# Patient Record
Sex: Female | Born: 1960 | Race: Black or African American | Hispanic: No | Marital: Single | Smoking: Never smoker
Health system: Southern US, Community
[De-identification: ages and names within clinical notes are randomized; demographics above are authoritative.]

## PROBLEM LIST (undated history)

## (undated) DIAGNOSIS — K589 Irritable bowel syndrome without diarrhea: Secondary | ICD-10-CM

## (undated) DIAGNOSIS — I10 Essential (primary) hypertension: Secondary | ICD-10-CM

## (undated) DIAGNOSIS — K219 Gastro-esophageal reflux disease without esophagitis: Secondary | ICD-10-CM

---

## 2009-05-22 ENCOUNTER — Ambulatory Visit: Payer: Self-pay | Admitting: Family Medicine

## 2010-07-21 ENCOUNTER — Emergency Department: Payer: Self-pay | Admitting: Emergency Medicine

## 2010-12-19 DIAGNOSIS — R109 Unspecified abdominal pain: Secondary | ICD-10-CM | POA: Insufficient documentation

## 2010-12-19 DIAGNOSIS — I10 Essential (primary) hypertension: Secondary | ICD-10-CM | POA: Insufficient documentation

## 2010-12-19 DIAGNOSIS — M79609 Pain in unspecified limb: Secondary | ICD-10-CM | POA: Insufficient documentation

## 2010-12-19 NOTE — ED Notes (Signed)
C/o right groin pain tonight; left LE throbbing for 4 months and right foot throbbing for 2 weeks and right arm throbbing as well

## 2010-12-20 ENCOUNTER — Emergency Department (HOSPITAL_COMMUNITY)
Admission: EM | Admit: 2010-12-20 | Discharge: 2010-12-20 | Disposition: A | Discharge status: Home / Self Care | Payer: BC Managed Care – PPO | Attending: Emergency Medicine | Admitting: Emergency Medicine

## 2010-12-20 ENCOUNTER — Encounter: Payer: Self-pay | Admitting: *Deleted

## 2010-12-20 DIAGNOSIS — M79606 Pain in leg, unspecified: Secondary | ICD-10-CM

## 2010-12-20 HISTORY — DX: Essential (primary) hypertension: I10

## 2010-12-20 MED ORDER — IBUPROFEN 800 MG PO TABS
800.0000 mg | ORAL_TABLET | Freq: Once | ORAL | Status: AC
  Administered 2010-12-20: 800 mg via ORAL
  Filled 2010-12-20: qty 1

## 2010-12-20 MED ORDER — IBUPROFEN 800 MG PO TABS: 800.0000 mg | ORAL_TABLET | Freq: Three times a day (TID) | ORAL | Status: AC

## 2011-01-07 NOTE — ED Provider Notes (Signed)
History     CSN: [id number] Arrival date & time: 12/20/2010 12:19 AM   None     Chief Complaint  Patient presents with  . Groin Pain    (Consider location/radiation/quality/duration/timing/severity/associated sxs/prior treatment) HPI Comments: Seen 0246. Patient with right sided complaints. Right groin pain x 4 months that is worse with movement. Better after a night's sleep. Has not taken any medications. Also has had right foot burning and aching for 2 weeks without a known injury. Elevation helps a little. Has not taken any medications. In the last two days, right arm has begun aching like the foot. Denies weakness, numbness, tingling, fever, chills. Denies difficulty speaking or swallowing. Patient is right handed and has no change in the strength of the hand.  Patient is a 50 y.o. female presenting with groin pain. The history is provided by the patient.  Groin Pain This is a chronic (groin pain x 4 months) problem. The problem occurs constantly. The problem has not changed since onset.Associated symptoms comments: Right foot pain. The symptoms are aggravated by bending and twisting. The symptoms are relieved by nothing. She has tried nothing for the symptoms.    Past Medical History  Diagnosis Date  . Hypertension     Past Surgical History  Procedure Date  . Cesarean section     No family history on file.  History  Substance Use Topics  . Smoking status: Never Smoker   . Smokeless tobacco: Not on file  . Alcohol Use: No    OB History    Grav Para Term Preterm Abortions TAB SAB Ect Mult Living                  Review of Systems  Musculoskeletal:       Right arm, right foot, and right groin pain  All other systems reviewed and are negative.    Allergies  Review of patient's allergies indicates no known allergies.  Home Medications   Current Outpatient Rx  Name Route Sig Dispense Refill  . HYDROCHLOROTHIAZIDE 25 MG PO TABS Oral Take 25 mg by mouth  daily.        BP 155/78  Pulse 64  Temp(Src) 97.9 F (36.6 C) (Oral)  Resp 18  Ht 5' (1.524 m)  Wt 165 lb (74.844 kg)  BMI 32.22 kg/m2  SpO2 100%  LMP 12/12/2010  Physical Exam  Nursing note and vitals reviewed. Constitutional: She is oriented to person, place, and time. She appears well-developed and well-nourished. No distress.  HENT:  Head: Normocephalic and atraumatic.  Eyes: EOM are normal.  Neck: Normal range of motion. Neck supple.  Cardiovascular: Normal rate, normal heart sounds and intact distal pulses.   Pulmonary/Chest: Effort normal and breath sounds normal.  Abdominal: Soft. Bowel sounds are normal.  Musculoskeletal:       Right shoulder with FROM, no crepitus, no deformities. Pulss 2+. Cap refill brisk. Strength 5/5. Right foot without lesions. FROM at ankle. Able to stand on toes. No lesions, deformities, swelling. Cap refill brisk. DP and PT pulses 2+. Right groin with tenderness to palpation. No lesions, swollen nodes, rash. FROM at hip without additional discomfort to the groin.  Neurological: She is alert and oriented to person, place, and time. She has normal reflexes.  Skin: Skin is warm and dry.    ED Course  Procedures (including critical care time)  Labs Reviewed - No data to display No results found.   1. Leg pain  MDM  Patient with right sided body pain without focal findings on PE except for mild tenderness to right groin. Given ibuprofen for discomfort. Follow up with PCP.Pt stable in ED with no significant deterioration in condition. MDM Reviewed: nursing note and vitals        Nicoletta Dress. Colon Branch, MD 01/07/11 1332

## 2012-01-30 ENCOUNTER — Ambulatory Visit: Payer: Self-pay | Admitting: Family Medicine

## 2012-02-04 ENCOUNTER — Ambulatory Visit: Payer: Self-pay | Admitting: Family Medicine

## 2012-08-27 ENCOUNTER — Emergency Department: Payer: Self-pay | Admitting: Emergency Medicine

## 2012-08-27 LAB — COMPREHENSIVE METABOLIC PANEL
Alkaline Phosphatase: 51 U/L (ref 50–136)
Chloride: 108 mmol/L — ABNORMAL HIGH (ref 98–107)
Creatinine: 0.56 mg/dL — ABNORMAL LOW (ref 0.60–1.30)
EGFR (African American): >60
EGFR (Non-African Amer.): >60
Glucose: 92 mg/dL (ref 65–99)
Total Protein: 7.9 g/dL (ref 6.4–8.2)

## 2012-08-27 LAB — URINALYSIS, COMPLETE
Bacteria: NONE SEEN
Glucose,UR: NEGATIVE mg/dL (ref 0–75)
Ketone: NEGATIVE
Leukocyte Esterase: NEGATIVE
Ph: 7 (ref 4.5–8.0)
Squamous Epithelial: <1

## 2012-08-27 LAB — TROPONIN I: Troponin-I: <0.02 ng/mL

## 2012-08-27 LAB — CBC WITH DIFFERENTIAL/PLATELET
Basophil #: 0 10*3/uL (ref 0.0–0.1)
Eosinophil %: 2.1 %
HCT: 38.4 % (ref 35.0–47.0)
HGB: 12.9 g/dL (ref 12.0–16.0)
MCH: 30.2 pg (ref 26.0–34.0)
MCV: 90 fL (ref 80–100)
Neutrophil #: 2.2 10*3/uL (ref 1.4–6.5)

## 2012-08-27 LAB — CK TOTAL AND CKMB (NOT AT ARMC): CK-MB: 1 ng/mL (ref 0.5–3.6)

## 2013-06-19 ENCOUNTER — Emergency Department: Payer: Self-pay | Admitting: Emergency Medicine

## 2013-06-19 LAB — BASIC METABOLIC PANEL
Anion Gap: 6 — ABNORMAL LOW (ref 7–16)
BUN: 10 mg/dL (ref 7–18)
CALCIUM: 9.7 mg/dL (ref 8.5–10.1)
Chloride: 98 mmol/L (ref 98–107)
Co2: 29 mmol/L (ref 21–32)
Creatinine: 0.87 mg/dL (ref 0.60–1.30)
EGFR (African American): >60
EGFR (Non-African Amer.): >60
Glucose: 109 mg/dL — ABNORMAL HIGH (ref 65–99)
Osmolality: 266 (ref 275–301)
POTASSIUM: 3.6 mmol/L (ref 3.5–5.1)
SODIUM: 133 mmol/L — AB (ref 136–145)

## 2013-06-19 LAB — CBC
HCT: 38.7 % (ref 35.0–47.0)
HGB: 12.3 g/dL (ref 12.0–16.0)
MCH: 29.1 pg (ref 26.0–34.0)
MCHC: 31.9 g/dL — ABNORMAL LOW (ref 32.0–36.0)
MCV: 91 fL (ref 80–100)
Platelet: 260 10*3/uL (ref 150–440)
RBC: 4.25 10*6/uL (ref 3.80–5.20)
RDW: 13.8 % (ref 11.5–14.5)
WBC: 11.6 10*3/uL — AB (ref 3.6–11.0)

## 2013-06-19 LAB — HEPATIC FUNCTION PANEL A (ARMC)
ALBUMIN: 3.9 g/dL (ref 3.4–5.0)
ALT: 23 U/L (ref 12–78)
AST: 12 U/L — AB (ref 15–37)
Alkaline Phosphatase: 50 U/L
BILIRUBIN TOTAL: 1 mg/dL (ref 0.2–1.0)
Bilirubin, Direct: 0.2 mg/dL (ref 0.00–0.20)
Total Protein: 8.4 g/dL — ABNORMAL HIGH (ref 6.4–8.2)

## 2013-06-19 LAB — LIPASE, BLOOD: LIPASE: 225 U/L (ref 73–393)

## 2013-06-19 LAB — TROPONIN I: Troponin-I: <0.02 ng/mL

## 2014-02-17 DIAGNOSIS — K922 Gastrointestinal hemorrhage, unspecified: Secondary | ICD-10-CM | POA: Insufficient documentation

## 2014-02-17 DIAGNOSIS — K625 Hemorrhage of anus and rectum: Secondary | ICD-10-CM | POA: Insufficient documentation

## 2014-03-24 ENCOUNTER — Ambulatory Visit: Payer: Self-pay | Admitting: Family Medicine

## 2014-04-13 ENCOUNTER — Emergency Department: Payer: Self-pay | Admitting: Emergency Medicine

## 2014-04-13 LAB — CBC WITH DIFFERENTIAL/PLATELET
BASOS PCT: 0.3 %
Basophil #: 0 10*3/uL (ref 0.0–0.1)
EOS ABS: 0 10*3/uL (ref 0.0–0.7)
EOS PCT: 0.1 %
HCT: 41.7 % (ref 35.0–47.0)
HGB: 13.4 g/dL (ref 12.0–16.0)
Lymphocyte #: 0.3 10*3/uL — ABNORMAL LOW (ref 1.0–3.6)
Lymphocyte %: 3.6 %
MCH: 28.9 pg (ref 26.0–34.0)
MCHC: 32.2 g/dL (ref 32.0–36.0)
MCV: 90 fL (ref 80–100)
MONO ABS: 0.4 x10 3/mm (ref 0.2–0.9)
Monocyte %: 4.4 %
NEUTROS ABS: 8.2 10*3/uL — AB (ref 1.4–6.5)
Neutrophil %: 91.6 %
Platelet: 289 10*3/uL (ref 150–440)
RBC: 4.64 10*6/uL (ref 3.80–5.20)
RDW: 14.3 % (ref 11.5–14.5)
WBC: 8.9 10*3/uL (ref 3.6–11.0)

## 2014-04-13 LAB — COMPREHENSIVE METABOLIC PANEL
ALK PHOS: 51 U/L (ref 46–116)
ANION GAP: 6 — AB (ref 7–16)
Albumin: 3.9 g/dL (ref 3.4–5.0)
BILIRUBIN TOTAL: 0.7 mg/dL (ref 0.2–1.0)
BUN: 11 mg/dL (ref 7–18)
CALCIUM: 9.4 mg/dL (ref 8.5–10.1)
CHLORIDE: 103 mmol/L (ref 98–107)
CO2: 29 mmol/L (ref 21–32)
CREATININE: 0.73 mg/dL (ref 0.60–1.30)
Glucose: 106 mg/dL — ABNORMAL HIGH (ref 65–99)
OSMOLALITY: 275 (ref 275–301)
POTASSIUM: 3.5 mmol/L (ref 3.5–5.1)
SGOT(AST): 32 U/L (ref 15–37)
SGPT (ALT): 36 U/L (ref 14–63)
SODIUM: 138 mmol/L (ref 136–145)
Total Protein: 8.4 g/dL — ABNORMAL HIGH (ref 6.4–8.2)

## 2014-04-13 LAB — LIPASE, BLOOD: Lipase: 98 U/L (ref 73–393)

## 2014-05-20 ENCOUNTER — Emergency Department: Payer: Self-pay | Admitting: Emergency Medicine

## 2014-06-04 ENCOUNTER — Emergency Department: Payer: Self-pay | Admitting: Physician Assistant

## 2014-11-11 ENCOUNTER — Emergency Department
Admission: EM | Admit: 2014-11-11 | Discharge: 2014-11-11 | Disposition: A | Discharge status: Home / Self Care | Payer: BLUE CROSS/BLUE SHIELD | Attending: Emergency Medicine | Admitting: Emergency Medicine

## 2014-11-11 ENCOUNTER — Encounter: Payer: Self-pay | Admitting: *Deleted

## 2014-11-11 DIAGNOSIS — R197 Diarrhea, unspecified: Secondary | ICD-10-CM | POA: Insufficient documentation

## 2014-11-11 DIAGNOSIS — I10 Essential (primary) hypertension: Secondary | ICD-10-CM | POA: Insufficient documentation

## 2014-11-11 LAB — COMPREHENSIVE METABOLIC PANEL
ALK PHOS: 48 U/L (ref 38–126)
ALT: 23 U/L (ref 14–54)
AST: 26 U/L (ref 15–41)
Albumin: 4.5 g/dL (ref 3.5–5.0)
Anion gap: 8 (ref 5–15)
BUN: 15 mg/dL (ref 6–20)
CALCIUM: 9.3 mg/dL (ref 8.9–10.3)
CO2: 30 mmol/L (ref 22–32)
CREATININE: 0.8 mg/dL (ref 0.44–1.00)
Chloride: 102 mmol/L (ref 101–111)
Glucose, Bld: 107 mg/dL — ABNORMAL HIGH (ref 65–99)
Potassium: 3.9 mmol/L (ref 3.5–5.1)
Sodium: 140 mmol/L (ref 135–145)
Total Bilirubin: 0.7 mg/dL (ref 0.3–1.2)
Total Protein: 8.4 g/dL — ABNORMAL HIGH (ref 6.5–8.1)

## 2014-11-11 LAB — CBC WITH DIFFERENTIAL/PLATELET
Basophils Absolute: 0.1 10*3/uL (ref 0–0.1)
Basophils Relative: 1 %
EOS ABS: 0.1 10*3/uL (ref 0–0.7)
EOS PCT: 3 %
HCT: 42.7 % (ref 35.0–47.0)
Hemoglobin: 13.3 g/dL (ref 12.0–16.0)
LYMPHS ABS: 1.8 10*3/uL (ref 1.0–3.6)
Lymphocytes Relative: 35 %
MCH: 28.2 pg (ref 26.0–34.0)
MCHC: 31.2 g/dL — ABNORMAL LOW (ref 32.0–36.0)
MCV: 90.3 fL (ref 80.0–100.0)
MONO ABS: 0.5 10*3/uL (ref 0.2–0.9)
Monocytes Relative: 10 %
Neutro Abs: 2.6 10*3/uL (ref 1.4–6.5)
Neutrophils Relative %: 51 %
PLATELETS: 308 10*3/uL (ref 150–440)
RBC: 4.73 MIL/uL (ref 3.80–5.20)
RDW: 14 % (ref 11.5–14.5)
WBC: 5.1 10*3/uL (ref 3.6–11.0)

## 2014-11-11 LAB — URINALYSIS COMPLETE WITH MICROSCOPIC (ARMC ONLY)
BILIRUBIN URINE: NEGATIVE
Bacteria, UA: NONE SEEN
GLUCOSE, UA: NEGATIVE mg/dL
Hgb urine dipstick: NEGATIVE
KETONES UR: NEGATIVE mg/dL
Leukocytes, UA: NEGATIVE
Nitrite: NEGATIVE
PH: 6 (ref 5.0–8.0)
Protein, ur: NEGATIVE mg/dL
RBC / HPF: NONE SEEN RBC/hpf (ref 0–5)
SQUAMOUS EPITHELIAL / LPF: NONE SEEN
Specific Gravity, Urine: 1.01 (ref 1.005–1.030)
WBC, UA: NONE SEEN WBC/hpf (ref 0–5)

## 2014-11-11 LAB — TROPONIN I

## 2014-11-11 MED ORDER — SODIUM CHLORIDE 0.9 % IV BOLUS (SEPSIS)
1000.0000 mL | Freq: Once | INTRAVENOUS | Status: AC
Start: 2014-11-11 — End: 2014-11-11
  Administered 2014-11-11: 1000 mL via INTRAVENOUS

## 2014-11-11 NOTE — ED Provider Notes (Signed)
[facility] Emergency Department Provider Note  Time seen: 2:10 AM  I have reviewed the triage vital signs and the nursing notes.   HISTORY  Chief Complaint Diarrhea and Dizziness    HPI Shelley Flowers is a 54 y.o. female with a past medical history of hypertension presents the emergency department with nausea and diarrhea. According to the patient she felt fine until 11 PM she awoke with an urge to have a bowel movement. States she had several loose stools, began feeling nauseated and dizzy so she came to the emergency department for evaluation. Denies any vomiting, denies any abdominal pain, fever. She does feel nauseated currently, denies any dysuria. States she has had 3-4 bowel movements, last of which was approximately 30-40 minutes ago. Denies any bloody or black stool. States she was feeling dizzy, but denies any dizziness currently.     Past Medical History  Diagnosis Date  . Hypertension     There are no active problems to display for this patient.   Past Surgical History  Procedure Laterality Date  . Cesarean section      Current Outpatient Rx  Name  Route  Sig  Dispense  Refill  . hydrochlorothiazide (HYDRODIURIL) 25 MG tablet   Oral   Take 25 mg by mouth daily.             Allergies Review of patient's allergies indicates no known allergies.  No family history on file.  Social History Social History  Substance Use Topics  . Smoking status: Never Smoker   . Smokeless tobacco: None  . Alcohol Use: No    Review of Systems Constitutional: Negative for fever. Cardiovascular: Negative for chest pain. Respiratory: Negative for shortness of breath. Gastrointestinal: Negative for abdominal pain. Positive for diarrhea. Genitourinary: Negative for dysuria. Musculoskeletal: Negative for back pain. 10-point ROS otherwise negative.  ____________________________________________   PHYSICAL EXAM:  VITAL SIGNS: ED Triage  Vitals  Enc Vitals Group     BP 11/11/14 0144 148/97 mmHg     Pulse Rate 11/11/14 0144 74     Resp 11/11/14 0144 18     Temp 11/11/14 0144 98.3 F (36.8 C)     Temp Source 11/11/14 0144 Oral     SpO2 11/11/14 0144 100 %     Weight 11/11/14 0144 160 lb (72.576 kg)     Height 11/11/14 0144 5' (1.524 m)     Head Cir --      Peak Flow --      Pain Score 11/11/14 0144 0     Pain Loc --      Pain Edu? --      Excl. in GC? --     Constitutional: Alert and oriented. Well appearing and in no distress. Eyes: Normal exam ENT   Mouth/Throat: Mucous membranes are moist. Cardiovascular: Normal rate, regular rhythm. No murmur Respiratory: Normal respiratory effort without tachypnea nor retractions. Breath sounds are clear and equal bilaterally. No wheezes/rales/rhonchi. Gastrointestinal: Soft and nontender. No distention.  Musculoskeletal: Nontender with normal range of motion in all extremities.  Neurologic:  Normal speech and language. No gross focal neurologic deficits Skin:  Skin is warm, dry and intact.  Psychiatric: Mood and affect are normal. Speech and behavior are normal. Patient exhibits appropriate insight and judgment.  ____________________________________________     INITIAL IMPRESSION / ASSESSMENT AND PLAN / ED COURSE  Pertinent labs & imaging results that were available during my care of the patient were reviewed by me and  considered in my medical decision making (see chart for details).  Patient appears overall very well. Describes diarrhea starting several hours prior to presentation. Also states nausea and dizziness. The nausea and dizziness she states has resolved. No abdominal tenderness to palpation. Denies any abdominal pain. Labs are within normal limits, patient has received IV fluids and nausea medication. Patient feels well. We'll discharge home with supportive care. Patient agreeable plan.  ____________________________________________   FINAL CLINICAL  IMPRESSION(S) / ED DIAGNOSES  Diarrhea   Minna Antis, MD 11/11/14 [id number]

## 2014-11-11 NOTE — ED Notes (Signed)
Pt reports onset of diarrhea around 11pm, associated with nausea, dizziness, and feeling cold. Denies fevers. Last took Zofran ODT just PTA

## 2014-11-11 NOTE — Discharge Instructions (Signed)
Please drink plenty of fluids over the next several days. You may use over-the-counter Imodium/loperamide as needed for diarrhea, as written on the box. Return to the emergency department for any abdominal pain, fever, or any other symptom personally concerning to her self.   Diarrhea Diarrhea is frequent loose and watery bowel movements. It can cause you to feel weak and dehydrated. Dehydration can cause you to become tired and thirsty, have a dry mouth, and have decreased urination that often is dark yellow. Diarrhea is a sign of another problem, most often an infection that will not last long. In most cases, diarrhea typically lasts 2-3 days. However, it can last longer if it is a sign of something more serious. It is important to treat your diarrhea as directed by your caregiver to lessen or prevent future episodes of diarrhea. CAUSES  Some common causes include:  Gastrointestinal infections caused by viruses, bacteria, or parasites.  Food poisoning or food allergies.  Certain medicines, such as antibiotics, chemotherapy, and laxatives.  Artificial sweeteners and fructose.  Digestive disorders. HOME CARE INSTRUCTIONS  Ensure adequate fluid intake (hydration): Have 1 cup (8 oz) of fluid for each diarrhea episode. Avoid fluids that contain simple sugars or sports drinks, fruit juices, whole milk products, and sodas. Your urine should be clear or pale yellow if you are drinking enough fluids. Hydrate with an oral rehydration solution that you can purchase at pharmacies, retail stores, and online. You can prepare an oral rehydration solution at home by mixing the following ingredients together:   - tsp table salt.   tsp baking soda.   tsp salt substitute containing potassium chloride.  1  tablespoons sugar.  1 L (34 oz) of water.  Certain foods and beverages may increase the speed at which food moves through the gastrointestinal (GI) tract. These foods and beverages should be avoided  and include:  Caffeinated and alcoholic beverages.  High-fiber foods, such as raw fruits and vegetables, nuts, seeds, and whole grain breads and cereals.  Foods and beverages sweetened with sugar alcohols, such as xylitol, sorbitol, and mannitol.  Some foods may be well tolerated and may help thicken stool including:  Starchy foods, such as rice, toast, pasta, low-sugar cereal, oatmeal, grits, baked potatoes, crackers, and bagels.  Bananas.  Applesauce.  Add probiotic-rich foods to help increase healthy bacteria in the GI tract, such as yogurt and fermented milk products.  Wash your hands well after each diarrhea episode.  Only take over-the-counter or prescription medicines as directed by your caregiver.  Take a warm bath to relieve any burning or pain from frequent diarrhea episodes. SEEK IMMEDIATE MEDICAL CARE IF:   You are unable to keep fluids down.  You have persistent vomiting.  You have blood in your stool, or your stools are black and tarry.  You do not urinate in 6-8 hours, or there is only a small amount of very dark urine.  You have abdominal pain that increases or localizes.  You have weakness, dizziness, confusion, or light-headedness.  You have a severe headache.  Your diarrhea gets worse or does not get better.  You have a fever or persistent symptoms for more than 2-3 days.  You have a fever and your symptoms suddenly get worse. MAKE SURE YOU:   Understand these instructions.  Will watch your condition.  Will get help right away if you are not doing well or get worse. Document Released: 02/15/2002 Document Revised: 07/12/2013 Document Reviewed: 11/03/2011 [facility] Patient Information 2015 [place], [place]. This  information is not intended to replace advice given to you by your health care provider. Make sure you discuss any questions you have with your health care provider. ° °

## 2014-11-13 DIAGNOSIS — R197 Diarrhea, unspecified: Secondary | ICD-10-CM | POA: Insufficient documentation

## 2014-11-13 DIAGNOSIS — R109 Unspecified abdominal pain: Secondary | ICD-10-CM | POA: Insufficient documentation

## 2014-11-13 LAB — URINALYSIS COMPLETE WITH MICROSCOPIC (ARMC ONLY)
Bacteria, UA: NONE SEEN
Bilirubin Urine: NEGATIVE
GLUCOSE, UA: NEGATIVE mg/dL
Hgb urine dipstick: NEGATIVE
KETONES UR: NEGATIVE mg/dL
LEUKOCYTES UA: NEGATIVE
NITRITE: NEGATIVE
Protein, ur: NEGATIVE mg/dL
RBC / HPF: NONE SEEN RBC/hpf (ref 0–5)
SPECIFIC GRAVITY, URINE: 1.027 (ref 1.005–1.030)
Squamous Epithelial / LPF: NONE SEEN
pH: 5 (ref 5.0–8.0)

## 2014-11-13 LAB — COMPREHENSIVE METABOLIC PANEL
ALBUMIN: 4.3 g/dL (ref 3.5–5.0)
ALK PHOS: 48 U/L (ref 38–126)
ALT: 18 U/L (ref 14–54)
ANION GAP: 5 (ref 5–15)
AST: 21 U/L (ref 15–41)
BILIRUBIN TOTAL: 0.6 mg/dL (ref 0.3–1.2)
BUN: 19 mg/dL (ref 6–20)
CALCIUM: 9.1 mg/dL (ref 8.9–10.3)
CO2: 28 mmol/L (ref 22–32)
Chloride: 107 mmol/L (ref 101–111)
Creatinine, Ser: 0.93 mg/dL (ref 0.44–1.00)
GFR calc Af Amer: >60 mL/min (ref >60)
GLUCOSE: 87 mg/dL (ref 65–99)
Potassium: 4.3 mmol/L (ref 3.5–5.1)
Sodium: 140 mmol/L (ref 135–145)
TOTAL PROTEIN: 7.8 g/dL (ref 6.5–8.1)

## 2014-11-13 LAB — CBC
HCT: 37.7 % (ref 35.0–47.0)
HEMOGLOBIN: 12.2 g/dL (ref 12.0–16.0)
MCH: 29 pg (ref 26.0–34.0)
MCHC: 32.3 g/dL (ref 32.0–36.0)
MCV: 89.5 fL (ref 80.0–100.0)
Platelets: 289 10*3/uL (ref 150–440)
RBC: 4.21 MIL/uL (ref 3.80–5.20)
RDW: 14.1 % (ref 11.5–14.5)
WBC: 5.4 10*3/uL (ref 3.6–11.0)

## 2014-11-13 LAB — LIPASE, BLOOD: Lipase: 29 U/L (ref 22–51)

## 2014-11-13 NOTE — ED Notes (Signed)
Patient reports burning in abdomen also reports nausea and diarrhea.

## 2014-11-14 ENCOUNTER — Emergency Department
Admission: EM | Admit: 2014-11-14 | Discharge: 2014-11-14 | Discharge status: Against Medical Advice | Payer: BLUE CROSS/BLUE SHIELD | Attending: Emergency Medicine | Admitting: Emergency Medicine

## 2014-11-18 ENCOUNTER — Emergency Department
Admission: EM | Admit: 2014-11-18 | Discharge: 2014-11-18 | Discharge status: Against Medical Advice | Payer: BLUE CROSS/BLUE SHIELD | Attending: Emergency Medicine | Admitting: Emergency Medicine

## 2014-11-18 ENCOUNTER — Encounter: Payer: Self-pay | Admitting: *Deleted

## 2014-11-18 DIAGNOSIS — I10 Essential (primary) hypertension: Secondary | ICD-10-CM | POA: Diagnosis not present

## 2014-11-18 DIAGNOSIS — R142 Eructation: Secondary | ICD-10-CM | POA: Diagnosis not present

## 2014-11-18 DIAGNOSIS — R110 Nausea: Secondary | ICD-10-CM | POA: Insufficient documentation

## 2014-11-18 LAB — URINALYSIS COMPLETE WITH MICROSCOPIC (ARMC ONLY)
BACTERIA UA: NONE SEEN
Bilirubin Urine: NEGATIVE
GLUCOSE, UA: NEGATIVE mg/dL
Hgb urine dipstick: NEGATIVE
KETONES UR: NEGATIVE mg/dL
Leukocytes, UA: NEGATIVE
NITRITE: NEGATIVE
Protein, ur: NEGATIVE mg/dL
SPECIFIC GRAVITY, URINE: 1.013 (ref 1.005–1.030)
pH: 7 (ref 5.0–8.0)

## 2014-11-18 LAB — COMPREHENSIVE METABOLIC PANEL
ALT: 16 U/L (ref 14–54)
ANION GAP: 7 (ref 5–15)
AST: 21 U/L (ref 15–41)
Albumin: 4.3 g/dL (ref 3.5–5.0)
Alkaline Phosphatase: 48 U/L (ref 38–126)
BUN: 14 mg/dL (ref 6–20)
CHLORIDE: 101 mmol/L (ref 101–111)
CO2: 29 mmol/L (ref 22–32)
Calcium: 9.5 mg/dL (ref 8.9–10.3)
Creatinine, Ser: 0.68 mg/dL (ref 0.44–1.00)
GFR calc Af Amer: >60 mL/min (ref >60)
Glucose, Bld: 144 mg/dL — ABNORMAL HIGH (ref 65–99)
POTASSIUM: 3.3 mmol/L — AB (ref 3.5–5.1)
Sodium: 137 mmol/L (ref 135–145)
Total Bilirubin: 0.7 mg/dL (ref 0.3–1.2)
Total Protein: 8 g/dL (ref 6.5–8.1)

## 2014-11-18 LAB — CBC
HCT: 38.4 % (ref 35.0–47.0)
Hemoglobin: 12.9 g/dL (ref 12.0–16.0)
MCH: 29.6 pg (ref 26.0–34.0)
MCHC: 33.5 g/dL (ref 32.0–36.0)
MCV: 88.3 fL (ref 80.0–100.0)
Platelets: 289 10*3/uL (ref 150–440)
RBC: 4.35 MIL/uL (ref 3.80–5.20)
RDW: 13.9 % (ref 11.5–14.5)
WBC: 5.3 10*3/uL (ref 3.6–11.0)

## 2014-11-18 LAB — LIPASE, BLOOD: LIPASE: 22 U/L (ref 22–51)

## 2014-11-18 MED ORDER — ONDANSETRON 4 MG PO TBDP
ORAL_TABLET | ORAL | Status: DC
Start: 2014-11-18 — End: 2014-11-19
  Filled 2014-11-18: qty 1

## 2014-11-18 MED ORDER — ONDANSETRON 4 MG PO TBDP
4.0000 mg | ORAL_TABLET | Freq: Once | ORAL | Status: AC | PRN
  Administered 2014-11-18: 4 mg via ORAL

## 2014-11-18 NOTE — ED Notes (Signed)
Pt c/o intermittent nausea x1 week, also admits to associating "gas and burping" - pt denies any vomiting, diarrhea, fever or abd pain.

## 2014-11-18 NOTE — ED Notes (Signed)
In room to assess pt and pt not in room. Gown noted to be on bed. Pt not in bathroom.

## 2015-01-06 ENCOUNTER — Encounter: Payer: Self-pay | Admitting: *Deleted

## 2015-01-06 DIAGNOSIS — R197 Diarrhea, unspecified: Secondary | ICD-10-CM | POA: Diagnosis not present

## 2015-01-06 DIAGNOSIS — R358 Other polyuria: Secondary | ICD-10-CM | POA: Diagnosis not present

## 2015-01-06 DIAGNOSIS — R42 Dizziness and giddiness: Secondary | ICD-10-CM | POA: Insufficient documentation

## 2015-01-06 DIAGNOSIS — R110 Nausea: Secondary | ICD-10-CM | POA: Diagnosis not present

## 2015-01-06 LAB — URINALYSIS COMPLETE WITH MICROSCOPIC (ARMC ONLY)
BACTERIA UA: NONE SEEN
BILIRUBIN URINE: NEGATIVE
Glucose, UA: NEGATIVE mg/dL
HGB URINE DIPSTICK: NEGATIVE
Ketones, ur: NEGATIVE mg/dL
LEUKOCYTES UA: NEGATIVE
Nitrite: NEGATIVE
PH: 6 (ref 5.0–8.0)
Protein, ur: NEGATIVE mg/dL
SQUAMOUS EPITHELIAL / LPF: NONE SEEN
Specific Gravity, Urine: 1.004 — ABNORMAL LOW (ref 1.005–1.030)
WBC UA: NONE SEEN WBC/hpf (ref 0–5)

## 2015-01-06 NOTE — ED Notes (Signed)
Patient states that around 1800 today, she felt nauseous and dizzy, had one -two episodes of diarrhea. Patient also c/o polyuria.

## 2015-01-07 ENCOUNTER — Emergency Department
Admission: EM | Admit: 2015-01-07 | Discharge: 2015-01-07 | Discharge status: Against Medical Advice | Payer: BLUE CROSS/BLUE SHIELD | Attending: Emergency Medicine | Admitting: Emergency Medicine

## 2015-02-03 ENCOUNTER — Emergency Department
Admission: EM | Admit: 2015-02-03 | Discharge: 2015-02-03 | Disposition: A | Discharge status: Home / Self Care | Payer: BLUE CROSS/BLUE SHIELD | Attending: Emergency Medicine | Admitting: Emergency Medicine

## 2015-02-03 DIAGNOSIS — R42 Dizziness and giddiness: Secondary | ICD-10-CM | POA: Insufficient documentation

## 2015-02-03 DIAGNOSIS — Z79899 Other long term (current) drug therapy: Secondary | ICD-10-CM | POA: Diagnosis not present

## 2015-02-03 DIAGNOSIS — I10 Essential (primary) hypertension: Secondary | ICD-10-CM | POA: Insufficient documentation

## 2015-02-03 DIAGNOSIS — R358 Other polyuria: Secondary | ICD-10-CM | POA: Insufficient documentation

## 2015-02-03 DIAGNOSIS — R110 Nausea: Secondary | ICD-10-CM | POA: Diagnosis not present

## 2015-02-03 LAB — URINALYSIS COMPLETE WITH MICROSCOPIC (ARMC ONLY)
Bacteria, UA: NONE SEEN
Bilirubin Urine: NEGATIVE
GLUCOSE, UA: NEGATIVE mg/dL
Hgb urine dipstick: NEGATIVE
KETONES UR: NEGATIVE mg/dL
Leukocytes, UA: NEGATIVE
NITRITE: NEGATIVE
Protein, ur: NEGATIVE mg/dL
SPECIFIC GRAVITY, URINE: 1.02 (ref 1.005–1.030)
Squamous Epithelial / LPF: NONE SEEN
pH: 5 (ref 5.0–8.0)

## 2015-02-03 LAB — BASIC METABOLIC PANEL
ANION GAP: 8 (ref 5–15)
BUN: 12 mg/dL (ref 6–20)
CHLORIDE: 100 mmol/L — AB (ref 101–111)
CO2: 28 mmol/L (ref 22–32)
CREATININE: 0.71 mg/dL (ref 0.44–1.00)
Calcium: 9.7 mg/dL (ref 8.9–10.3)
GFR calc non Af Amer: >60 mL/min (ref >60)
Glucose, Bld: 98 mg/dL (ref 65–99)
POTASSIUM: 3.6 mmol/L (ref 3.5–5.1)
SODIUM: 136 mmol/L (ref 135–145)

## 2015-02-03 LAB — CBC
HCT: 40.3 % (ref 35.0–47.0)
HEMOGLOBIN: 13.2 g/dL (ref 12.0–16.0)
MCH: 29.2 pg (ref 26.0–34.0)
MCHC: 32.8 g/dL (ref 32.0–36.0)
MCV: 88.8 fL (ref 80.0–100.0)
Platelets: 277 10*3/uL (ref 150–440)
RBC: 4.54 MIL/uL (ref 3.80–5.20)
RDW: 13.5 % (ref 11.5–14.5)
WBC: 5.1 10*3/uL (ref 3.6–11.0)

## 2015-02-03 MED ORDER — ONDANSETRON 4 MG PO TBDP
4.0000 mg | ORAL_TABLET | Freq: Once | ORAL | Status: AC
  Administered 2015-02-03: 4 mg via ORAL

## 2015-02-03 MED ORDER — ONDANSETRON 4 MG PO TBDP
ORAL_TABLET | ORAL | Status: AC
  Filled 2015-02-03: qty 1

## 2015-02-03 MED ORDER — ONDANSETRON HCL 4 MG PO TABS: 4.0000 mg | ORAL_TABLET | Freq: Every day | ORAL | Status: DC | PRN

## 2015-02-03 NOTE — ED Notes (Signed)
Pt d/c to home by self. Pt is alert, ambulatory, and with NAD noted att this time.

## 2015-02-03 NOTE — ED Provider Notes (Signed)
[facility] Emergency Department Provider Note  ____________________________________________  Time seen: On arrival  I have reviewed the triage vital signs and the nursing notes.   HISTORY  Chief Complaint Dizziness    HPI Shelley Flowers is a 54 y.o. female who reports over the last week that whenever she eats she then gets "gassy" followed by nauseous and occasionally gets dizzy. She denies chest pain. No fevers no chills. No shortness of breath. No cough. She feels well now although mildly nauseous. She denies a history of lactose intolerance. She has tried altering her diet without success. She denies abdominal pain     Past Medical History  Diagnosis Date  . Hypertension     There are no active problems to display for this patient.   Past Surgical History  Procedure Laterality Date  . Cesarean section      Current Outpatient Rx  Name  Route  Sig  Dispense  Refill  . hydrochlorothiazide (HYDRODIURIL) 25 MG tablet   Oral   Take 25 mg by mouth daily.         . ondansetron (ZOFRAN) 4 MG tablet   Oral   Take 1 tablet (4 mg total) by mouth daily as needed for nausea or vomiting.   20 tablet   1     Allergies Review of patient's allergies indicates no known allergies.  No family history on file.  Social History Social History  Substance Use Topics  . Smoking status: Never Smoker   . Smokeless tobacco: None  . Alcohol Use: No    Review of Systems  Constitutional: Negative for fever. Eyes: Negative for visual changes. ENT: Negative for sore throat Cardiovascular: Negative for chest pain. Respiratory: Negative for shortness of breath. Gastrointestinal: Negative for abdominal pain,  Genitourinary: Negative for dysuria. Positive for polyuria Musculoskeletal: Negative for back pain. Skin: Negative for rash. Neurological: Negative for headaches or focal weakness Psychiatric: No  anxiety    ____________________________________________   PHYSICAL EXAM:  VITAL SIGNS: ED Triage Vitals  Enc Vitals Group     BP 02/03/15 1742 155/91 mmHg     Pulse Rate 02/03/15 1742 73     Resp 02/03/15 1742 14     Temp 02/03/15 1742 98 F (36.7 C)     Temp Source 02/03/15 1742 Oral     SpO2 02/03/15 1742 100 %     Weight 02/03/15 1742 150 lb (68.04 kg)     Height 02/03/15 1742 5' (1.524 m)     Head Cir --      Peak Flow --      Pain Score --      Pain Loc --      Pain Edu? --      Excl. in GC? --      Constitutional: Alert and oriented. Well appearing and in no distress. Eyes: Conjunctivae are normal.  ENT   Head: Normocephalic and atraumatic.   Mouth/Throat: Mucous membranes are moist. Cardiovascular: Normal rate, regular rhythm. Normal and symmetric distal pulses are present in all extremities. No murmurs, rubs, or gallops. Respiratory: Normal respiratory effort without tachypnea nor retractions. Breath sounds are clear and equal bilaterally.  Gastrointestinal: Soft and non-tender in all quadrants. No distention. There is no CVA tenderness. Genitourinary: deferred Musculoskeletal: Nontender with normal range of motion in all extremities. No lower extremity tenderness nor edema. Neurologic:  Normal speech and language. No gross focal neurologic deficits are appreciated. Skin:  Skin is warm, dry and intact. No  rash noted. Psychiatric: Mood and affect are normal. Patient exhibits appropriate insight and judgment.  ____________________________________________    LABS (pertinent positives/negatives)  Labs Reviewed  BASIC METABOLIC PANEL - Abnormal; Notable for the following:    Chloride 100 (*)    All other components within normal limits  URINALYSIS COMPLETEWITH MICROSCOPIC ([facility] ONLY) - Abnormal; Notable for the following:    Color, Urine YELLOW (*)    APPearance CLEAR (*)    All other components within normal limits  CBC  CBG MONITORING, ED     ____________________________________________   EKG  ED ECG REPORT I, Jene Every, the attending physician, personally viewed and interpreted this ECG.  Date: 02/03/2015 EKG Time: 5:52 PM Rate: 67 Rhythm: normal sinus rhythm QRS Axis: normal Intervals: normal ST/T Wave abnormalities: normal Conduction Disutrbances: none Narrative Interpretation: unremarkable   ____________________________________________    RADIOLOGY I have personally reviewed any xrays that were ordered on this patient: None  ____________________________________________   PROCEDURES  Procedure(s) performed: none  Critical Care performed: none  ____________________________________________   INITIAL IMPRESSION / ASSESSMENT AND PLAN / ED COURSE  Pertinent labs & imaging results that were available during my care of the patient were reviewed by me and considered in my medical decision making (see chart for details).  Patient well-appearing with unremarkable vitals and normal labs. Her EKG is normal. She has no dizziness or emergency department. She complains of mild intermittent nausea after eating. She has no abdominal pain or tenderness. Given that she looks well in the  emergency department and her workup and exam is unremarkable i believe Outpatient follow-up is appropriate.  ____________________________________________   FINAL CLINICAL IMPRESSION(S) / ED DIAGNOSES  Final diagnoses:  Dizziness  Nausea     Jene Every, MD 02/03/15 [id number]

## 2015-02-03 NOTE — ED Notes (Signed)
Pt c/o lightheadedness with nausea for the past week worse in the past 2 days.

## 2015-02-03 NOTE — Discharge Instructions (Signed)

## 2015-03-02 ENCOUNTER — Other Ambulatory Visit: Payer: Self-pay | Admitting: Family Medicine

## 2015-03-02 DIAGNOSIS — Z1231 Encounter for screening mammogram for malignant neoplasm of breast: Secondary | ICD-10-CM

## 2015-11-22 ENCOUNTER — Emergency Department
Admission: EM | Admit: 2015-11-22 | Discharge: 2015-11-22 | Disposition: A | Discharge status: Home / Self Care | Payer: BLUE CROSS/BLUE SHIELD | Attending: Student | Admitting: Student

## 2015-11-22 ENCOUNTER — Encounter: Payer: Self-pay | Admitting: Intensive Care

## 2015-11-22 DIAGNOSIS — G43909 Migraine, unspecified, not intractable, without status migrainosus: Secondary | ICD-10-CM | POA: Diagnosis present

## 2015-11-22 DIAGNOSIS — G43009 Migraine without aura, not intractable, without status migrainosus: Secondary | ICD-10-CM | POA: Diagnosis not present

## 2015-11-22 DIAGNOSIS — I10 Essential (primary) hypertension: Secondary | ICD-10-CM | POA: Insufficient documentation

## 2015-11-22 LAB — URINALYSIS COMPLETE WITH MICROSCOPIC (ARMC ONLY)
BILIRUBIN URINE: NEGATIVE
Bacteria, UA: NONE SEEN
Glucose, UA: NEGATIVE mg/dL
HGB URINE DIPSTICK: NEGATIVE
KETONES UR: NEGATIVE mg/dL
LEUKOCYTES UA: NEGATIVE
NITRITE: NEGATIVE
PH: 6 (ref 5.0–8.0)
Protein, ur: NEGATIVE mg/dL
RBC / HPF: NONE SEEN RBC/hpf (ref 0–5)
Specific Gravity, Urine: 1.002 — ABNORMAL LOW (ref 1.005–1.030)
WBC, UA: NONE SEEN WBC/hpf (ref 0–5)

## 2015-11-22 LAB — POCT PREGNANCY, URINE: Preg Test, Ur: NEGATIVE

## 2015-11-22 MED ORDER — SODIUM CHLORIDE 0.9 % IV BOLUS (SEPSIS)
1000.0000 mL | Freq: Once | INTRAVENOUS | Status: AC
  Administered 2015-11-22: 1000 mL via INTRAVENOUS

## 2015-11-22 MED ORDER — KETOROLAC TROMETHAMINE 30 MG/ML IJ SOLN
15.0000 mg | Freq: Once | INTRAMUSCULAR | Status: AC
  Administered 2015-11-22: 15 mg via INTRAVENOUS
  Filled 2015-11-22: qty 1

## 2015-11-22 MED ORDER — DIPHENHYDRAMINE HCL 50 MG/ML IJ SOLN
12.5000 mg | Freq: Once | INTRAMUSCULAR | Status: AC
  Administered 2015-11-22: 12.5 mg via INTRAVENOUS
  Filled 2015-11-22: qty 1

## 2015-11-22 MED ORDER — METOCLOPRAMIDE HCL 5 MG/ML IJ SOLN
10.0000 mg | Freq: Once | INTRAMUSCULAR | Status: AC
  Administered 2015-11-22: 10 mg via INTRAVENOUS
  Filled 2015-11-22: qty 2

## 2015-11-22 NOTE — ED Triage Notes (Signed)
Pt presents to ER with c/o a migraine and nausea that woke her up from her sleep this AM. Pt was at work this morning at [employer] and they sent her here for HTN. 152/89, 136/89, and 143/89 taken at work. Pt ambulatory in triage with NAD noted. Pt also c/o lightheadedness. Speech clear and denies blurry vision.

## 2015-11-22 NOTE — ED Notes (Signed)
EDP at bedside  

## 2015-11-22 NOTE — ED Provider Notes (Signed)
[facility] Emergency Department Provider Note   ____________________________________________   First MD Initiated Contact with Patient 11/22/15 [phone]     (approximate)  I have reviewed the triage vital signs and the nursing notes.   HISTORY  Chief Complaint Hypertension and Migraine    HPI Shelley Flowers is a 55 y.o. female with history of migraines and hypertension who presents for evaluation of migraine headache today which feels similar to her usual migraine headaches, the headache is frontal in nature, noted on awakening this morning, currently 8 out of 10 in severity, associated with nausea as are her usual migraines, constant since onset, gradual. No associated word finding difficulties, numbness or weakness, she has had some lightheadedness but denies any room spinning dizziness. She denies any chest pain or difficulty breathing. Prior to today she was in her usual state of health. Her boss told her to see the nurse today and they checked her blood pressure and they got 3 separate readings including 152/89, 136/89 and 143/89 so she was referred to the emergency department for evaluation. She has had increased urinary frequency, she reports compliance with HCTZ.   Past Medical History:  Diagnosis Date  . Hypertension     There are no active problems to display for this patient.   Past Surgical History:  Procedure Laterality Date  . CESAREAN SECTION      Prior to Admission medications   Medication Sig Start Date End Date Taking? Authorizing Provider  hydrochlorothiazide (HYDRODIURIL) 25 MG tablet Take 25 mg by mouth daily.    Historical Provider, MD  ondansetron (ZOFRAN) 4 MG tablet Take 1 tablet (4 mg total) by mouth daily as needed for nausea or vomiting. 02/03/15   Jene Everyobert Kinner, MD    Allergies Review of patient's allergies indicates no known allergies.  History reviewed. No pertinent family history.  Social History Social History    Substance Use Topics  . Smoking status: Never Smoker  . Smokeless tobacco: Never Used  . Alcohol use No    Review of Systems Constitutional: No fever/chills Eyes: No visual changes. ENT: No sore throat. Cardiovascular: Denies chest pain. Respiratory: Denies shortness of breath. Gastrointestinal: No abdominal pain.  No nausea, no vomiting.  No diarrhea.  No constipation. Genitourinary: Negative for dysuria. Musculoskeletal: Negative for back pain. Skin: Negative for rash. Neurological: Positive for headaches, no focal weakness or numbness.  10-point ROS otherwise negative.  ____________________________________________   PHYSICAL EXAM:  Vitals:   11/22/15 1015 11/22/15 1030 11/22/15 1100 11/22/15 1130  BP:  126/79 129/79 121/72  Pulse: 64 74 75 75  Resp:  16 16 16   Temp:      TempSrc:      SpO2: 99% 98% 98% 98%  Weight:      Height:        VITAL SIGNS: ED Triage Vitals  Enc Vitals Group     BP 11/22/15 0944 (!) 146/82     Pulse Rate 11/22/15 0944 70     Resp 11/22/15 0944 16     Temp 11/22/15 0944 97.8 F (36.6 C)     Temp Source 11/22/15 0944 Oral     SpO2 11/22/15 0944 100 %     Weight 11/22/15 0945 158 lb (71.7 kg)     Height 11/22/15 0945 5' (1.524 m)     Head Circumference --      Peak Flow --      Pain Score 11/22/15 0949 10     Pain Loc --  Pain Edu? --      Excl. in GC? --     Constitutional: Alert and oriented. Well appearing and in no acute distress. Eyes: Conjunctivae are normal. PERRL. EOMI. Head: Atraumatic. Nose: No congestion/rhinnorhea. Mouth/Throat: Mucous membranes are moist.  Oropharynx non-erythematous. Neck: No stridor. Supple without meningismus. Cardiovascular: Normal rate, regular rhythm. Grossly normal heart sounds.  Good peripheral circulation. Respiratory: Normal respiratory effort.  No retractions. Lungs CTAB. Gastrointestinal: Soft and nontender. No distention.  No CVA tenderness. Genitourinary:  deferred Musculoskeletal: No lower extremity tenderness nor edema.  No joint effusions. Neurologic:  Normal speech and language. No gross focal neurologic deficits are appreciated. No gait instability. 5 out of 5 strength in bilateral upper and lower extremities, sensation intact to light touch throughout, cranial nerves II through XII intact, normal finger-nose-finger. Skin:  Skin is warm, dry and intact. No rash noted. Psychiatric: Mood and affect are normal. Speech and behavior are normal.  ____________________________________________   LABS (all labs ordered are listed, but only abnormal results are displayed)  Labs Reviewed  URINALYSIS COMPLETEWITH MICROSCOPIC ([facility] ONLY) - Abnormal; Notable for the following:       Result Value   Color, Urine COLORLESS (*)    APPearance CLEAR (*)    Specific Gravity, Urine 1.002 (*)    Squamous Epithelial / LPF 0-5 (*)    All other components within normal limits  POCT PREGNANCY, URINE  POC URINE PREG, ED   ____________________________________________  EKG  none ____________________________________________  RADIOLOGY  none ____________________________________________   PROCEDURES  Procedure(s) performed: None  Procedures  Critical Care performed: No  ____________________________________________   INITIAL IMPRESSION / ASSESSMENT AND PLAN / ED COURSE  Pertinent labs & imaging results that were available during my care of the patient were reviewed by me and considered in my medical decision making (see chart for details).  Shelley Flowers is a 55 y.o. female with history of migraines and hypertension who presents for evaluation of migraine headache today which feels similar to her usual migraine headaches, the headache is frontal in nature, noted on awakening this morning, currently 8 out of 10 in severity, associated with nausea as are her usual migraines. She also had hypertensive blood pressure readings when her blood  pressure was measured at work. On exam, she is very well-appearing and in no acute distress. Her vital signs are stable and she is afebrile. She has an intact neurological exam, not consistent with CVA, subarachnoid hemorrhage or meningitis. She reports that this is typical of her usual migraines, we'll treat with migraine cocktail and reassess for disposition. She has very mild hypertension here, blood pressure 146/82, history of same.  ----------------------------------------- 12:11 PM on 11/22/2015 ----------------------------------------- Patient reports complete resolution of her headache at this time. Blood pressure has improved. Urinalysis not consistent with infection. Negative pregnancy test. We discussed return precautions and need for close PCP and neurology follow-up and she is comfortable with the discharge plan. DC home.   Clinical Course     ____________________________________________   FINAL CLINICAL IMPRESSION(S) / ED DIAGNOSES  Final diagnoses:  Migraine without status migrainosus, not intractable, unspecified migraine type  Essential hypertension      NEW MEDICATIONS STARTED DURING THIS VISIT:  New Prescriptions   No medications on file     Note:  This document was prepared using Dragon voice recognition software and may include unintentional dictation errors.    Gayla Doss, MD 11/22/15 1212

## 2016-04-15 ENCOUNTER — Emergency Department
Admission: EM | Admit: 2016-04-15 | Discharge: 2016-04-15 | Disposition: A | Discharge status: Home / Self Care | Payer: BLUE CROSS/BLUE SHIELD | Attending: Emergency Medicine | Admitting: Emergency Medicine

## 2016-04-15 DIAGNOSIS — Z79899 Other long term (current) drug therapy: Secondary | ICD-10-CM | POA: Diagnosis not present

## 2016-04-15 DIAGNOSIS — R110 Nausea: Secondary | ICD-10-CM | POA: Diagnosis present

## 2016-04-15 DIAGNOSIS — K529 Noninfective gastroenteritis and colitis, unspecified: Secondary | ICD-10-CM | POA: Diagnosis not present

## 2016-04-15 DIAGNOSIS — I10 Essential (primary) hypertension: Secondary | ICD-10-CM | POA: Diagnosis not present

## 2016-04-15 LAB — URINALYSIS, COMPLETE (UACMP) WITH MICROSCOPIC
Bacteria, UA: NONE SEEN
Bilirubin Urine: NEGATIVE
Glucose, UA: NEGATIVE mg/dL
Hgb urine dipstick: NEGATIVE
KETONES UR: NEGATIVE mg/dL
Leukocytes, UA: NEGATIVE
Nitrite: NEGATIVE
PH: 5 (ref 5.0–8.0)
Protein, ur: NEGATIVE mg/dL
SPECIFIC GRAVITY, URINE: 1.019 (ref 1.005–1.030)
SQUAMOUS EPITHELIAL / LPF: NONE SEEN

## 2016-04-15 LAB — CBC
HEMATOCRIT: 40.9 % (ref 35.0–47.0)
HEMOGLOBIN: 13.9 g/dL (ref 12.0–16.0)
MCH: 30.7 pg (ref 26.0–34.0)
MCHC: 34 g/dL (ref 32.0–36.0)
MCV: 90.2 fL (ref 80.0–100.0)
Platelets: 338 10*3/uL (ref 150–440)
RBC: 4.53 MIL/uL (ref 3.80–5.20)
RDW: 13.6 % (ref 11.5–14.5)
WBC: 9.9 10*3/uL (ref 3.6–11.0)

## 2016-04-15 LAB — COMPREHENSIVE METABOLIC PANEL
ALBUMIN: 4.6 g/dL (ref 3.5–5.0)
ALT: 18 U/L (ref 14–54)
ANION GAP: 10 (ref 5–15)
AST: 20 U/L (ref 15–41)
Alkaline Phosphatase: 46 U/L (ref 38–126)
BILIRUBIN TOTAL: 0.8 mg/dL (ref 0.3–1.2)
BUN: 11 mg/dL (ref 6–20)
CALCIUM: 9.1 mg/dL (ref 8.9–10.3)
CO2: 25 mmol/L (ref 22–32)
Chloride: 101 mmol/L (ref 101–111)
Creatinine, Ser: 0.69 mg/dL (ref 0.44–1.00)
Glucose, Bld: 100 mg/dL — ABNORMAL HIGH (ref 65–99)
POTASSIUM: 3.6 mmol/L (ref 3.5–5.1)
Sodium: 136 mmol/L (ref 135–145)
TOTAL PROTEIN: 8.3 g/dL — AB (ref 6.5–8.1)

## 2016-04-15 LAB — LIPASE, BLOOD: LIPASE: 28 U/L (ref 11–51)

## 2016-04-15 LAB — INFLUENZA PANEL BY PCR (TYPE A & B)
INFLBPCR: NEGATIVE
Influenza A By PCR: NEGATIVE

## 2016-04-15 MED ORDER — PROMETHAZINE HCL 12.5 MG PO TABS: 25.0000 mg | ORAL_TABLET | Freq: Four times a day (QID) | ORAL | 0 refills | Status: DC | PRN

## 2016-04-15 MED ORDER — ONDANSETRON HCL 4 MG PO TABS: 4.0000 mg | ORAL_TABLET | Freq: Every day | ORAL | 0 refills | Status: AC | PRN

## 2016-04-15 MED ORDER — CALCIUM CARBONATE ANTACID 500 MG PO CHEW
1.0000 | CHEWABLE_TABLET | Freq: Once | ORAL | Status: AC
  Administered 2016-04-15: 200 mg via ORAL
  Filled 2016-04-15: qty 1

## 2016-04-15 MED ORDER — ONDANSETRON HCL 4 MG/2ML IJ SOLN
4.0000 mg | Freq: Once | INTRAMUSCULAR | Status: AC
  Administered 2016-04-15: 4 mg via INTRAVENOUS
  Filled 2016-04-15: qty 2

## 2016-04-15 MED ORDER — CALCIUM CARBONATE ANTACID 500 MG PO CHEW: 1.0000 | CHEWABLE_TABLET | Freq: Every day | ORAL | 0 refills | Status: AC

## 2016-04-15 MED ORDER — PROMETHAZINE HCL 25 MG PO TABS
25.0000 mg | ORAL_TABLET | Freq: Once | ORAL | Status: AC
  Administered 2016-04-15: 25 mg via ORAL
  Filled 2016-04-15: qty 1

## 2016-04-15 MED ORDER — SODIUM CHLORIDE 0.9 % IV BOLUS (SEPSIS)
1000.0000 mL | Freq: Once | INTRAVENOUS | Status: AC
  Administered 2016-04-15: 1000 mL via INTRAVENOUS

## 2016-04-15 NOTE — ED Triage Notes (Signed)
Pt c/o nausea/vomiting/diarrhea since Saturday - reports no vomiting in the last 24 hours and 6 loose stools in 24 hours - denies any other symptoms

## 2016-04-15 NOTE — ED Notes (Signed)
Patient reports nausea and diarrhea since Saturday with frequent belching.  Patient denies vomiting.

## 2016-04-15 NOTE — ED Provider Notes (Signed)
[facility] Emergency Department Provider Note  ____________________________________________  Time seen: Approximately 8:50 PM  I have reviewed the triage vital signs and the nursing notes.   HISTORY  Chief Complaint Nausea and Diarrhea    HPI DALIS BEERS is a 56 y.o. female presents to the emergency department withnausea, diarrhea, belching since Saturday. Patient has had 4 episodes of diarrhea today. Patient has been eating less but is drinking well. Patient denies any other cold symptoms. Patient has not taken anything for symptoms. Patient denies fever, shortness breath, chest pain, abdominal pain, vomiting.   Past Medical History:  Diagnosis Date  . Hypertension     There are no active problems to display for this patient.   Past Surgical History:  Procedure Laterality Date  . CESAREAN SECTION      Prior to Admission medications   Medication Sig Start Date End Date Taking? Authorizing Provider  calcium carbonate (TUMS) 500 MG chewable tablet Chew 1 tablet (200 mg of elemental calcium total) by mouth daily. 04/15/16 04/15/17  Enid Derry, PA-C  hydrochlorothiazide (HYDRODIURIL) 25 MG tablet Take 25 mg by mouth daily.    Historical Provider, MD  ondansetron (ZOFRAN) 4 MG tablet Take 1 tablet (4 mg total) by mouth daily as needed for nausea or vomiting. 02/03/15   Jene Every, MD  ondansetron (ZOFRAN) 4 MG tablet Take 1 tablet (4 mg total) by mouth daily as needed for nausea or vomiting. 04/15/16 04/15/17  Enid Derry, PA-C  promethazine (PHENERGAN) 12.5 MG tablet Take 2 tablets (25 mg total) by mouth every 6 (six) hours as needed for nausea or vomiting. 04/15/16   Enid Derry, PA-C    Allergies Patient has no known allergies.  No family history on file.  Social History Social History  Substance Use Topics  . Smoking status: Never Smoker  . Smokeless tobacco: Never Used  . Alcohol use No     Review of Systems  Constitutional: No  fever/chills ENT: No upper respiratory complaints. Cardiovascular: No chest pain. Respiratory: No cough. No SOB. Gastrointestinal: No abdominal pain.  No vomiting.  Musculoskeletal: Negative for musculoskeletal pain. Skin: Negative for rash, abrasions, lacerations, ecchymosis. Neurological: Negative for headaches, numbness or tingling   ____________________________________________   PHYSICAL EXAM:  VITAL SIGNS: ED Triage Vitals  Enc Vitals Group     BP 04/15/16 1634 (!) 141/80     Pulse Rate 04/15/16 1634 92     Resp 04/15/16 1634 16     Temp 04/15/16 1634 97.8 F (36.6 C)     Temp Source 04/15/16 1634 Oral     SpO2 04/15/16 1634 100 %     Weight 04/15/16 1635 150 lb (68 kg)     Height 04/15/16 1635 5' (1.524 m)     Head Circumference --      Peak Flow --      Pain Score 04/15/16 1648 0     Pain Loc --      Pain Edu? --      Excl. in GC? --      Constitutional: Alert and oriented. Well appearing and in no acute distress. Eyes: Conjunctivae are normal. PERRL. EOMI. Head: Atraumatic. ENT:      Ears: Tympanic membranes pearly gray with good landmarks.      Nose: No congestion/rhinnorhea.      Mouth/Throat: Mucous membranes are moist.  Neck: No stridor. Cardiovascular: Normal rate, regular rhythm.  Good peripheral circulation. Respiratory: Normal respiratory effort without tachypnea or retractions. Lungs CTAB.  Good air entry to the bases with no decreased or absent breath sounds. Gastrointestinal: Bowel sounds 4 quadrants. Soft and nontender to palpation. No guarding or rigidity. No palpable masses. No distention.  Musculoskeletal: Full range of motion to all extremities. No gross deformities appreciated. Neurologic:  Normal speech and language. No gross focal neurologic deficits are appreciated.  Skin:  Skin is warm, dry and intact. No rash noted. Psychiatric: Mood and affect are normal. Speech and behavior are normal. Patient exhibits appropriate insight and  judgement.   ____________________________________________   LABS (all labs ordered are listed, but only abnormal results are displayed)  Labs Reviewed  COMPREHENSIVE METABOLIC PANEL - Abnormal; Notable for the following:       Result Value   Glucose, Bld 100 (*)    Total Protein 8.3 (*)    All other components within normal limits  URINALYSIS, COMPLETE (UACMP) WITH MICROSCOPIC - Abnormal; Notable for the following:    Color, Urine YELLOW (*)    APPearance CLEAR (*)    All other components within normal limits  LIPASE, BLOOD  CBC  INFLUENZA PANEL BY PCR (TYPE A & B)   ____________________________________________  EKG   ____________________________________________  RADIOLOGY   No results found.  ____________________________________________    PROCEDURES  Procedure(s) performed:    Procedures    Medications  sodium chloride 0.9 % bolus 1,000 mL (0 mLs Intravenous Stopped 04/15/16 2023)  ondansetron (ZOFRAN) injection 4 mg (4 mg Intravenous Given 04/15/16 2018)  calcium carbonate (TUMS - dosed in mg elemental calcium) chewable tablet 200 mg of elemental calcium (200 mg of elemental calcium Oral Given 04/15/16 2151)  promethazine (PHENERGAN) tablet 25 mg (25 mg Oral Given 04/15/16 2151)     ____________________________________________   INITIAL IMPRESSION / ASSESSMENT AND PLAN / ED COURSE  Pertinent labs & imaging results that were available during my care of the patient were reviewed by me and considered in my medical decision making (see chart for details).  Review of the [place] CSRS was performed in accordance of the [facility] prior to dispensing any controlled drugs.     Patient's diagnosis is consistent with gastroenteritis. Vital signs, exam, and labs are reassuring. Patient's abdomen was nontender to palpation. Patient denies any abdominal pain and states the stomach was just bubbling. Influenza negative. No indication of infection on urinalysis. Patient was given  fluids and Zofran. Patient vomited once before Zofran was administered. Patient began to feel better after Zofran but stated that his stomach was still turning. Patient was then given Phenergan and Tums. Patient stated that symptoms had improved. Patient will be discharged home with prescriptions for Zofran, Phenergan, Tums. Patient is to follow up with PCP as directed. Patient is given ED precautions to return to the ED for any worsening or new symptoms.     ____________________________________________  FINAL CLINICAL IMPRESSION(S) / ED DIAGNOSES  Final diagnoses:  Gastroenteritis      NEW MEDICATIONS STARTED DURING THIS VISIT:  Discharge Medication List as of 04/15/2016 10:16 PM    START taking these medications   Details  calcium carbonate (TUMS) 500 MG chewable tablet Chew 1 tablet (200 mg of elemental calcium total) by mouth daily., Starting Mon 04/15/2016, Until Tue 04/15/2017, Print    !! ondansetron (ZOFRAN) 4 MG tablet Take 1 tablet (4 mg total) by mouth daily as needed for nausea or vomiting., Starting Mon 04/15/2016, Until Tue 04/15/2017, Print    promethazine (PHENERGAN) 12.5 MG tablet Take 2 tablets (25 mg total) by mouth every  6 (six) hours as needed for nausea or vomiting., Starting Mon 04/15/2016, Print     !! - Potential duplicate medications found. Please discuss with provider.          This chart was dictated using voice recognition software/Dragon. Despite best efforts to proofread, errors can occur which can change the meaning. Any change was purely unintentional.    Enid Derry, PA-C 04/15/16 2256    Arnaldo Natal, MD 04/15/16 2322

## 2016-06-16 ENCOUNTER — Emergency Department: Payer: BLUE CROSS/BLUE SHIELD

## 2016-06-16 ENCOUNTER — Encounter: Payer: Self-pay | Admitting: Emergency Medicine

## 2016-06-16 ENCOUNTER — Emergency Department
Admission: EM | Admit: 2016-06-16 | Discharge: 2016-06-16 | Discharge status: Against Medical Advice | Payer: BLUE CROSS/BLUE SHIELD | Attending: Emergency Medicine | Admitting: Emergency Medicine

## 2016-06-16 DIAGNOSIS — R002 Palpitations: Secondary | ICD-10-CM | POA: Diagnosis present

## 2016-06-16 DIAGNOSIS — I10 Essential (primary) hypertension: Secondary | ICD-10-CM | POA: Diagnosis not present

## 2016-06-16 LAB — CBC
HEMATOCRIT: 40.9 % (ref 35.0–47.0)
Hemoglobin: 13.5 g/dL (ref 12.0–16.0)
MCH: 30.1 pg (ref 26.0–34.0)
MCHC: 32.9 g/dL (ref 32.0–36.0)
MCV: 91.3 fL (ref 80.0–100.0)
PLATELETS: 279 10*3/uL (ref 150–440)
RBC: 4.48 MIL/uL (ref 3.80–5.20)
RDW: 14.2 % (ref 11.5–14.5)
WBC: 5.1 10*3/uL (ref 3.6–11.0)

## 2016-06-16 LAB — BASIC METABOLIC PANEL
Anion gap: 8 (ref 5–15)
BUN: 17 mg/dL (ref 6–20)
CHLORIDE: 100 mmol/L — AB (ref 101–111)
CO2: 29 mmol/L (ref 22–32)
CREATININE: 0.76 mg/dL (ref 0.44–1.00)
Calcium: 9.5 mg/dL (ref 8.9–10.3)
GFR calc Af Amer: >60 mL/min (ref >60)
GFR calc non Af Amer: >60 mL/min (ref >60)
Glucose, Bld: 109 mg/dL — ABNORMAL HIGH (ref 65–99)
POTASSIUM: 3.3 mmol/L — AB (ref 3.5–5.1)
Sodium: 137 mmol/L (ref 135–145)

## 2016-06-16 LAB — TROPONIN I: Troponin I: <0.03 ng/mL (ref <0.03)

## 2016-06-16 NOTE — ED Notes (Signed)
In to draw repeat Troponin, no one in room, gown on bed.  Called stat desk to have them called out patient's name.

## 2016-06-16 NOTE — ED Triage Notes (Signed)
Pt presents to ED with c/o heart palpitations that started earlier today. Pt states that this morning she started a new BP medication. Pt states took 1 dose Losartan-HCTZ 100-25 this morning. Pt denies chest pain, c/o "being able to feel heartbeat" pt states worse when laying down.

## 2016-06-16 NOTE — ED Notes (Signed)
Per First Nurse, no answer when name called in lobby.

## 2016-06-16 NOTE — ED Provider Notes (Signed)
[facility] Emergency Department Provider Note  ____________________________________________   First MD Initiated Contact with Patient 06/16/16 1925     (approximate)  I have reviewed the triage vital signs and the nursing notes.   HISTORY  Chief Complaint Palpitations   HPI Shelley Flowers is a 56 y.o. female with a history of hypertension is presenting to the emergency department with palpitations. She said that she started a combination pill of losartan and hydrocodone this morning. She took her first dose at 71 AM. Said that she began feeling palpitations at about 5 PM. She says that she felt what appeared to be a heartbeat sensation in her chest and also in the left side of her neck. She is denying any chest pain at this time. Denies any shortness of breath. Says she was weak at the time she was feeling the palpitations. However, she says that the palpitations have since resolved. She said that the entire episode lasted about one hour.   Past Medical History:  Diagnosis Date  . Hypertension     There are no active problems to display for this patient.   Past Surgical History:  Procedure Laterality Date  . CESAREAN SECTION      Prior to Admission medications   Medication Sig Start Date End Date Taking? Authorizing Provider  calcium carbonate (TUMS) 500 MG chewable tablet Chew 1 tablet (200 mg of elemental calcium total) by mouth daily. 04/15/16 04/15/17  Enid Derry, PA-C  hydrochlorothiazide (HYDRODIURIL) 25 MG tablet Take 25 mg by mouth daily.    Historical Provider, MD  ondansetron (ZOFRAN) 4 MG tablet Take 1 tablet (4 mg total) by mouth daily as needed for nausea or vomiting. 02/03/15   Jene Every, MD  ondansetron (ZOFRAN) 4 MG tablet Take 1 tablet (4 mg total) by mouth daily as needed for nausea or vomiting. 04/15/16 04/15/17  Enid Derry, PA-C  promethazine (PHENERGAN) 12.5 MG tablet Take 2 tablets (25 mg total) by mouth every 6 (six) hours  as needed for nausea or vomiting. 04/15/16   Enid Derry, PA-C    Allergies Patient has no known allergies.  No family history on file.  Social History Social History  Substance Use Topics  . Smoking status: Never Smoker  . Smokeless tobacco: Never Used  . Alcohol use No    Review of Systems Constitutional: No fever/chills Eyes: No visual changes. ENT: No sore throat. Cardiovascular:as above Respiratory: Denies shortness of breath. Gastrointestinal: No abdominal pain.  No nausea, no vomiting.  No diarrhea.  No constipation. Genitourinary: Negative for dysuria. Musculoskeletal: Negative for back pain. Skin: Negative for rash. Neurological: Negative for headaches, focal weakness or numbness.  10-point ROS otherwise negative.  ____________________________________________   PHYSICAL EXAM:  VITAL SIGNS: ED Triage Vitals  Enc Vitals Group     BP 06/16/16 1833 129/78     Pulse Rate 06/16/16 1833 78     Resp 06/16/16 1833 18     Temp 06/16/16 1833 98.4 F (36.9 C)     Temp Source 06/16/16 1833 Oral     SpO2 06/16/16 1833 99 %     Weight 06/16/16 1834 150 lb (68 kg)     Height 06/16/16 1834 5' (1.524 m)     Head Circumference --      Peak Flow --      Pain Score --      Pain Loc --      Pain Edu? --      Excl. in  GC? --     Constitutional: Alert and oriented. Well appearing and in no acute distress. Eyes: Conjunctivae are normal. PERRL. EOMI. Head: Atraumatic. Nose: No congestion/rhinnorhea. Mouth/Throat: Mucous membranes are moist.   Neck: No stridor.   Cardiovascular: Normal rate, regular rhythm. Grossly normal heart sounds.   Respiratory: Normal respiratory effort.  No retractions. Lungs CTAB. Gastrointestinal: Soft and nontender. No distention Musculoskeletal: No lower extremity tenderness nor edema.  No joint effusions. Neurologic:  Normal speech and language. No gross focal neurologic deficits are appreciated.  Skin:  Skin is warm, dry and intact. No  rash noted. Psychiatric: Mood and affect are normal. Speech and behavior are normal.  ____________________________________________   LABS (all labs ordered are listed, but only abnormal results are displayed)  Labs Reviewed  BASIC METABOLIC PANEL - Abnormal; Notable for the following:       Result Value   Potassium 3.3 (*)    Chloride 100 (*)    Glucose, Bld 109 (*)    All other components within normal limits  CBC  TROPONIN I  TROPONIN I   ____________________________________________  EKG  ED ECG REPORT I, Arelia Longest, the attending physician, personally viewed and interpreted this ECG.   Date: 06/16/2016  EKG Time: 1824  Rate: 88  Rhythm: normal sinus rhythm  Axis: normal  Intervals:none  ST&T Change: No ST segment elevation or depression. No abnormal T-wave inversion.  ____________________________________________  RADIOLOGY  DG Chest 2 View (Final result)  Result time 06/16/16 19:26:43  Final result by Hassan Buckler, MD (06/16/16 19:26:43)           Narrative:   CLINICAL DATA: Palpitations  EXAM: CHEST 2 VIEW  COMPARISON: 06/19/2013 chest radiograph.  FINDINGS: Stable cardiomediastinal silhouette with normal heart size. No pneumothorax. No pleural effusion. Lungs appear clear, with no acute consolidative airspace disease and no pulmonary edema.  IMPRESSION: No active cardiopulmonary disease.   Electronically Signed By: Delbert Phenix M.D. On: 06/16/2016 19:26            ____________________________________________   PROCEDURES  Procedure(s) performed:   Procedures  Critical Care performed:   ____________________________________________   INITIAL IMPRESSION / ASSESSMENT AND PLAN / ED COURSE  Pertinent labs & imaging results that were available during my care of the patient were reviewed by me and considered in my medical decision making (see chart for  details).  ----------------------------------------- 8:59 PM on 06/16/2016 -----------------------------------------  During the initial exam I explained to the patient that we will be rechecking a troponin and that I advised her to stop taking her accommodation bill returned or single HCTZ medication. However, when the nurse entered the room to draw the second troponin was found the patient had eloped. She was not given verbal or written discharge instructions. She did not tell anyone that she would be leaving.      ____________________________________________   FINAL CLINICAL IMPRESSION(S) / ED DIAGNOSES  Palpitations.    NEW MEDICATIONS STARTED DURING THIS VISIT:  Discharge Medication List as of 06/16/2016  8:45 PM       Note:  This document was prepared using Dragon voice recognition software and may include unintentional dictation errors.    Myrna Blazer, MD 06/16/16 2100

## 2016-06-18 ENCOUNTER — Telehealth: Payer: Self-pay | Admitting: Emergency Medicine

## 2016-06-18 NOTE — Telephone Encounter (Signed)
Called patient due to lwot to inquire about condition and follow up plans. Left message.   

## 2016-08-01 ENCOUNTER — Emergency Department
Admission: EM | Admit: 2016-08-01 | Discharge: 2016-08-01 | Disposition: A | Discharge status: Home / Self Care | Payer: BLUE CROSS/BLUE SHIELD | Attending: Student in an Organized Health Care Education/Training Program | Admitting: Student in an Organized Health Care Education/Training Program

## 2016-08-01 ENCOUNTER — Emergency Department: Payer: BLUE CROSS/BLUE SHIELD

## 2016-08-01 ENCOUNTER — Encounter: Payer: Self-pay | Admitting: Emergency Medicine

## 2016-08-01 DIAGNOSIS — M7121 Synovial cyst of popliteal space [Baker], right knee: Secondary | ICD-10-CM | POA: Diagnosis not present

## 2016-08-01 DIAGNOSIS — I10 Essential (primary) hypertension: Secondary | ICD-10-CM | POA: Insufficient documentation

## 2016-08-01 DIAGNOSIS — Z79899 Other long term (current) drug therapy: Secondary | ICD-10-CM | POA: Insufficient documentation

## 2016-08-01 DIAGNOSIS — M25561 Pain in right knee: Secondary | ICD-10-CM | POA: Diagnosis present

## 2016-08-01 MED ORDER — OXYCODONE-ACETAMINOPHEN 7.5-325 MG PO TABS: 1.0000 | ORAL_TABLET | Freq: Four times a day (QID) | ORAL | 0 refills | Status: DC | PRN

## 2016-08-01 NOTE — ED Notes (Signed)
Patient transported to Ultrasound 

## 2016-08-01 NOTE — ED Notes (Signed)
US results reviewed. Awaiting room for MD eval.  

## 2016-08-01 NOTE — ED Provider Notes (Signed)
[facility] Emergency Department Provider Note   ____________________________________________   None    (approximate)  I have reviewed the triage vital signs and the nursing notes.   HISTORY  Chief Complaint Knee Pain    HPI Shelley Flowers is a 56 y.o. female patient complaining of pain in the right popliteal area for one week. He stated last week she saw her PCP shot for pain and given meloxicam. Patient state medication is not helping. Patient states she contacted her PCP and has been scheduled for outpatient ultrasound to rule out DVTs. Patient stated pain is too severe to wait the next week for ultrasound.Patient rates the pain as a 10 over 10. Patient stated pain increases with extension of the right lower extremity. Patient denies chest pain or dyspnea. No other positive measures for this complaint.   Past Medical History:  Diagnosis Date  . Hypertension     There are no active problems to display for this patient.   Past Surgical History:  Procedure Laterality Date  . CESAREAN SECTION      Prior to Admission medications   Medication Sig Start Date End Date Taking? Authorizing Provider  calcium carbonate (TUMS) 500 MG chewable tablet Chew 1 tablet (200 mg of elemental calcium total) by mouth daily. 04/15/16 04/15/17  Enid Derry, PA-C  hydrochlorothiazide (HYDRODIURIL) 25 MG tablet Take 25 mg by mouth daily.    [provider]  ondansetron (ZOFRAN) 4 MG tablet Take 1 tablet (4 mg total) by mouth daily as needed for nausea or vomiting. 02/03/15   Jene Every, MD  ondansetron (ZOFRAN) 4 MG tablet Take 1 tablet (4 mg total) by mouth daily as needed for nausea or vomiting. 04/15/16 04/15/17  Enid Derry, PA-C  oxyCODONE-acetaminophen (PERCOCET) 7.5-325 MG tablet Take 1 tablet by mouth every 6 (six) hours as needed for severe pain. 08/01/16   Joni Reining, PA-C  promethazine (PHENERGAN) 12.5 MG tablet Take 2 tablets (25 mg total) by  mouth every 6 (six) hours as needed for nausea or vomiting. 04/15/16   Enid Derry, PA-C    Allergies Patient has no known allergies.  History reviewed. No pertinent family history.  Social History Social History  Substance Use Topics  . Smoking status: Never Smoker  . Smokeless tobacco: Never Used  . Alcohol use No    Review of Systems  Constitutional: No fever/chills Eyes: No visual changes. ENT: No sore throat. Cardiovascular: Denies chest pain. Respiratory: Denies shortness of breath. Gastrointestinal: No abdominal pain.  No nausea, no vomiting.  No diarrhea.  No constipation. Genitourinary: Negative for dysuria. Musculoskeletal: Right lower leg pain Skin: Negative for rash. Neurological: Negative for headaches, focal weakness or numbness. Endocrine:Hypertension  ____________________________________________   PHYSICAL EXAM:  VITAL SIGNS: ED Triage Vitals  Enc Vitals Group     BP 08/01/16 1129 (!) 113/59     Pulse Rate 08/01/16 1129 66     Resp 08/01/16 1129 18     Temp 08/01/16 1129 98.6 F (37 C)     Temp Source 08/01/16 1129 Oral     SpO2 08/01/16 1129 99 %     Weight 08/01/16 1129 150 lb (68 kg)     Height 08/01/16 1129 5' (1.524 m)     Head Circumference --      Peak Flow --      Pain Score 08/01/16 1140 10     Pain Loc --      Pain Edu? --  Excl. in GC? --     Constitutional: Alert and oriented. Well appearing and in no acute distress. Cardiovascular: Normal rate, regular rhythm. Grossly normal heart sounds.  Good peripheral circulation. Respiratory: Normal respiratory effort.  No retractions. Lungs CTAB. Musculoskeletal:Palpable nodule lesion right popliteal area. Moderate guarding palpation.  Neurologic:  Normal speech and language. No gross focal neurologic deficits are appreciated. No gait instability. Skin:  Skin is warm, dry and intact. No rash noted. Psychiatric: Mood and affect are normal. Speech and behavior are  normal.  ____________________________________________   LABS (all labs ordered are listed, but only abnormal results are displayed)  Labs Reviewed - No data to display ____________________________________________  EKG   ____________________________________________  RADIOLOGY  Heart sounds negative for DVT. Ultrasound revealed a 4.2 mm lesion in the popliteal area. ____________________________________________   PROCEDURES  Procedure(s) performed:   Procedures  Critical Care performed: No  ____________________________________________   INITIAL IMPRESSION / ASSESSMENT AND PLAN / ED COURSE  Pertinent labs & imaging results that were available during my care of the patient were reviewed by me and considered in my medical decision making (see chart for details).  Baker's cyst right popliteal area area. Discuss ultrasound findings with patient. Patient given discharge care instructions. Patient advised to follow-up with orthopedics for definitive evaluation and treatment.    ____________________________________________   FINAL CLINICAL IMPRESSION(S) / ED DIAGNOSES  Final diagnoses:  Baker's cyst of knee, right      NEW MEDICATIONS STARTED DURING THIS VISIT:  Discharge Medication List as of 08/01/2016  1:33 PM    START taking these medications   Details  oxyCODONE-acetaminophen (PERCOCET) 7.5-325 MG tablet Take 1 tablet by mouth every 6 (six) hours as needed for severe pain., Starting Thu 08/01/2016, Print         Note:  This document was prepared using Dragon voice recognition software and may include unintentional dictation errors.    Joni ReiningSmith, Seairra Otani K, PA-C 08/01/16 1415    Willy Eddyobinson, Patrick, MD 08/02/16 0430

## 2016-08-01 NOTE — ED Triage Notes (Signed)
Pt here for pain behind right knee. Saw PCP and had shot for pain and given meloxicam but not helping. Was supposed to have outpt US for DVT r/o but pain is too bad and cannot wait so came here.

## 2017-03-15 ENCOUNTER — Emergency Department
Admission: EM | Admit: 2017-03-15 | Discharge: 2017-03-15 | Disposition: A | Discharge status: Home / Self Care | Payer: BLUE CROSS/BLUE SHIELD | Attending: Student in an Organized Health Care Education/Training Program | Admitting: Student in an Organized Health Care Education/Training Program

## 2017-03-15 ENCOUNTER — Emergency Department: Payer: BLUE CROSS/BLUE SHIELD

## 2017-03-15 DIAGNOSIS — R1030 Lower abdominal pain, unspecified: Secondary | ICD-10-CM | POA: Diagnosis not present

## 2017-03-15 DIAGNOSIS — Z79899 Other long term (current) drug therapy: Secondary | ICD-10-CM | POA: Insufficient documentation

## 2017-03-15 DIAGNOSIS — I10 Essential (primary) hypertension: Secondary | ICD-10-CM | POA: Diagnosis not present

## 2017-03-15 DIAGNOSIS — R109 Unspecified abdominal pain: Secondary | ICD-10-CM

## 2017-03-15 LAB — COMPREHENSIVE METABOLIC PANEL
ALK PHOS: 53 U/L (ref 38–126)
ALT: 27 U/L (ref 14–54)
AST: 25 U/L (ref 15–41)
Albumin: 4.4 g/dL (ref 3.5–5.0)
Anion gap: 9 (ref 5–15)
BUN: 13 mg/dL (ref 6–20)
CALCIUM: 9.3 mg/dL (ref 8.9–10.3)
CO2: 26 mmol/L (ref 22–32)
CREATININE: 0.75 mg/dL (ref 0.44–1.00)
Chloride: 102 mmol/L (ref 101–111)
Glucose, Bld: 98 mg/dL (ref 65–99)
Potassium: 3.5 mmol/L (ref 3.5–5.1)
Sodium: 137 mmol/L (ref 135–145)
Total Bilirubin: 0.9 mg/dL (ref 0.3–1.2)
Total Protein: 8 g/dL (ref 6.5–8.1)

## 2017-03-15 LAB — URINALYSIS, COMPLETE (UACMP) WITH MICROSCOPIC
Bacteria, UA: NONE SEEN
Bilirubin Urine: NEGATIVE
GLUCOSE, UA: NEGATIVE mg/dL
HGB URINE DIPSTICK: NEGATIVE
Ketones, ur: NEGATIVE mg/dL
Leukocytes, UA: NEGATIVE
Nitrite: NEGATIVE
PH: 6 (ref 5.0–8.0)
PROTEIN: NEGATIVE mg/dL
RBC / HPF: NONE SEEN RBC/hpf (ref 0–5)
Specific Gravity, Urine: 1.003 — ABNORMAL LOW (ref 1.005–1.030)
Squamous Epithelial / LPF: NONE SEEN
WBC, UA: NONE SEEN WBC/hpf (ref 0–5)

## 2017-03-15 LAB — CBC
HCT: 38.1 % (ref 35.0–47.0)
Hemoglobin: 12.7 g/dL (ref 12.0–16.0)
MCH: 30.5 pg (ref 26.0–34.0)
MCHC: 33.4 g/dL (ref 32.0–36.0)
MCV: 91.4 fL (ref 80.0–100.0)
PLATELETS: 257 10*3/uL (ref 150–440)
RBC: 4.17 MIL/uL (ref 3.80–5.20)
RDW: 13.5 % (ref 11.5–14.5)
WBC: 7.6 10*3/uL (ref 3.6–11.0)

## 2017-03-15 LAB — LIPASE, BLOOD: Lipase: 48 U/L (ref 11–51)

## 2017-03-15 MED ORDER — DICYCLOMINE HCL 10 MG PO CAPS
ORAL_CAPSULE | ORAL | Status: AC
  Administered 2017-03-15: 10 mg via ORAL
  Filled 2017-03-15: qty 1

## 2017-03-15 MED ORDER — DICYCLOMINE HCL 10 MG PO CAPS: 10.0000 mg | ORAL_CAPSULE | Freq: Three times a day (TID) | ORAL | 0 refills | Status: DC | PRN

## 2017-03-15 MED ORDER — DICYCLOMINE HCL 10 MG PO CAPS
10.0000 mg | ORAL_CAPSULE | Freq: Once | ORAL | Status: AC
  Administered 2017-03-15: 10 mg via ORAL

## 2017-03-15 NOTE — Discharge Instructions (Signed)

## 2017-03-15 NOTE — ED Triage Notes (Signed)
FIRST NURSE NOTE-here for abdominal pain. NAD. ambulatory

## 2017-03-15 NOTE — ED Provider Notes (Addendum)
[facility] Emergency Department Provider Note    First MD Initiated Contact with Patient 03/15/17 2116     (approximate)  I have reviewed the triage vital signs and the nursing notes.   HISTORY  Chief Complaint Abdominal Pain    HPI Shelley Flowers is a 57 y.o. female who presents with roughly 1 week of lower abdominal pain intermittent nausea and some pressure urge sensation that she needs to use the bathroom and move her bowels.  Is not had this before.  No fevers.  No vomiting.  No chest pain or shortness of breath.  Is not tried anything to relieve the symptoms.  Has not noted any blood in her stools.   Past Medical History:  Diagnosis Date  . Hypertension    No family history on file. Past Surgical History:  Procedure Laterality Date  . CESAREAN SECTION     There are no active problems to display for this patient.     Prior to Admission medications   Medication Sig Start Date End Date Taking? Authorizing Provider  calcium carbonate (TUMS) 500 MG chewable tablet Chew 1 tablet (200 mg of elemental calcium total) by mouth daily. 04/15/16 04/15/17  Enid DerryWagner, Ashley, PA-C  hydrochlorothiazide (HYDRODIURIL) 25 MG tablet Take 25 mg by mouth daily.    [provider]  ondansetron (ZOFRAN) 4 MG tablet Take 1 tablet (4 mg total) by mouth daily as needed for nausea or vomiting. 02/03/15   Jene EveryKinner, Robert, MD  ondansetron (ZOFRAN) 4 MG tablet Take 1 tablet (4 mg total) by mouth daily as needed for nausea or vomiting. 04/15/16 04/15/17  Enid DerryWagner, Ashley, PA-C  oxyCODONE-acetaminophen (PERCOCET) 7.5-325 MG tablet Take 1 tablet by mouth every 6 (six) hours as needed for severe pain. 08/01/16   Joni ReiningSmith, Ronald K, PA-C  promethazine (PHENERGAN) 12.5 MG tablet Take 2 tablets (25 mg total) by mouth every 6 (six) hours as needed for nausea or vomiting. 04/15/16   Enid DerryWagner, Ashley, PA-C    Allergies Patient has no known allergies.    Social History Social History    Tobacco Use  . Smoking status: Never Smoker  . Smokeless tobacco: Never Used  Substance Use Topics  . Alcohol use: No  . Drug use: No    Review of Systems Patient denies headaches, rhinorrhea, blurry vision, numbness, shortness of breath, chest pain, edema, cough, abdominal pain, nausea, vomiting, diarrhea, dysuria, fevers, rashes or hallucinations unless otherwise stated above in HPI. ____________________________________________   PHYSICAL EXAM:  VITAL SIGNS: Vitals:   03/15/17 1911  BP: (!) 148/83  Pulse: 80  Resp: 17  Temp: 98.2 F (36.8 C)  SpO2: 100%    Constitutional: Alert and oriented. Well appearing and in no acute distress. Eyes: Conjunctivae are normal.  Head: Atraumatic. Nose: No congestion/rhinnorhea. Mouth/Throat: Mucous membranes are moist.   Neck: No stridor. Painless ROM.  Cardiovascular: Normal rate, regular rhythm. Grossly normal heart sounds.  Good peripheral circulation. Respiratory: Normal respiratory effort.  No retractions. Lungs CTAB. Gastrointestinal: Soft and nontender. No distention. No abdominal bruits. No CVA tenderness. Genitourinary: normal external genitalia, nonthrombosed nontender hemhrroid, no purulent discharge Musculoskeletal: No lower extremity tenderness nor edema.  No joint effusions. Neurologic:  Normal speech and language. No gross focal neurologic deficits are appreciated. No facial droop Skin:  Skin is warm, dry and intact. No rash noted. Psychiatric: Mood and affect are normal. Speech and behavior are normal.  ____________________________________________   LABS (all labs ordered are listed, but only abnormal results  are displayed)  Results for orders placed or performed during the hospital encounter of 03/15/17 (from the past 24 hour(s))  Lipase, blood     Status: None   Collection Time: 03/15/17  7:13 PM  Result Value Ref Range   Lipase 48 11 - 51 U/L  Comprehensive metabolic panel     Status: None   Collection  Time: 03/15/17  7:13 PM  Result Value Ref Range   Sodium 137 135 - 145 mmol/L   Potassium 3.5 3.5 - 5.1 mmol/L   Chloride 102 101 - 111 mmol/L   CO2 26 22 - 32 mmol/L   Glucose, Bld 98 65 - 99 mg/dL   BUN 13 6 - 20 mg/dL   Creatinine, Ser 1.61 0.44 - 1.00 mg/dL   Calcium 9.3 8.9 - 09.6 mg/dL   Total Protein 8.0 6.5 - 8.1 g/dL   Albumin 4.4 3.5 - 5.0 g/dL   AST 25 15 - 41 U/L   ALT 27 14 - 54 U/L   Alkaline Phosphatase 53 38 - 126 U/L   Total Bilirubin 0.9 0.3 - 1.2 mg/dL   GFR calc non Af Amer >60 >60 mL/min   GFR calc Af Amer >60 >60 mL/min   Anion gap 9 5 - 15  CBC     Status: None   Collection Time: 03/15/17  7:13 PM  Result Value Ref Range   WBC 7.6 3.6 - 11.0 K/uL   RBC 4.17 3.80 - 5.20 MIL/uL   Hemoglobin 12.7 12.0 - 16.0 g/dL   HCT 04.5 40.9 - 81.1 %   MCV 91.4 80.0 - 100.0 fL   MCH 30.5 26.0 - 34.0 pg   MCHC 33.4 32.0 - 36.0 g/dL   RDW 91.4 78.2 - 95.6 %   Platelets 257 150 - 440 K/uL  Urinalysis, Complete w Microscopic     Status: Abnormal   Collection Time: 03/15/17  7:13 PM  Result Value Ref Range   Color, Urine COLORLESS (A) YELLOW   APPearance CLEAR (A) CLEAR   Specific Gravity, Urine 1.003 (L) 1.005 - 1.030   pH 6.0 5.0 - 8.0   Glucose, UA NEGATIVE NEGATIVE mg/dL   Hgb urine dipstick NEGATIVE NEGATIVE   Bilirubin Urine NEGATIVE NEGATIVE   Ketones, ur NEGATIVE NEGATIVE mg/dL   Protein, ur NEGATIVE NEGATIVE mg/dL   Nitrite NEGATIVE NEGATIVE   Leukocytes, UA NEGATIVE NEGATIVE   RBC / HPF NONE SEEN 0 - 5 RBC/hpf   WBC, UA NONE SEEN 0 - 5 WBC/hpf   Bacteria, UA NONE SEEN NONE SEEN   Squamous Epithelial / LPF NONE SEEN NONE SEEN   ____________________________________________ ____________________________________________  RADIOLOGY  I personally reviewed all radiographic images ordered to evaluate for the above acute complaints and reviewed radiology reports and findings.  These findings were personally discussed with the patient.  Please see medical  record for radiology report.  ____________________________________________   PROCEDURES  Procedure(s) performed:  Procedures    Critical Care performed: no ____________________________________________   INITIAL IMPRESSION / ASSESSMENT AND PLAN / ED COURSE  Pertinent labs & imaging results that were available during my care of the patient were reviewed by me and considered in my medical decision making (see chart for details).  DDX: appy, enteritis, ibs, uti, sbom, constipation, perirectal abscess  KEARY HANAK is a 57 y.o. who presents to the ED with tunnel pain is described above.  Blood work is reassuring.  Abdominal exam is soft and benign.  No evidence of perirectal abscess  or proctitis.  No evidence of SBO.  No evidence of constipation.  Do suspect some component of IBS or enteritis.  Will trial course of Bentyl and have patient follow-up in GI.  Have discussed with the patient and available family all diagnostics and treatments performed thus far and all questions were answered to the best of my ability. The patient demonstrates understanding and agreement with plan.       ____________________________________________   FINAL CLINICAL IMPRESSION(S) / ED DIAGNOSES  Final diagnoses:  Abdominal pain      NEW MEDICATIONS STARTED DURING THIS VISIT:  This SmartLink is deprecated. Use AVSMEDLIST instead to display the medication list for a patient.   Note:  This document was prepared using Dragon voice recognition software and may include unintentional dictation errors.    Willy Eddy, MD 03/15/17 1610    Willy Eddy, MD 03/26/17 [phone]

## 2017-03-15 NOTE — ED Notes (Signed)
Reviewed discharge instructions, follow-up care, and prescriptions with patient. Patient verbalized understanding of all information reviewed. Patient stable, with no distress noted at this time.    

## 2017-03-15 NOTE — ED Notes (Signed)
This RN witnessed rectal exam for MD Roxan Hockeyobinson

## 2017-03-15 NOTE — ED Triage Notes (Signed)
Patient c/o lower abdominal pain, nausea, and rectal pressure X 1 week.

## 2017-04-25 ENCOUNTER — Other Ambulatory Visit: Payer: Self-pay

## 2017-04-25 ENCOUNTER — Emergency Department
Admission: EM | Admit: 2017-04-25 | Discharge: 2017-04-25 | Disposition: A | Discharge status: Home / Self Care | Payer: BLUE CROSS/BLUE SHIELD | Attending: Student in an Organized Health Care Education/Training Program | Admitting: Student in an Organized Health Care Education/Training Program

## 2017-04-25 ENCOUNTER — Encounter: Payer: Self-pay | Admitting: *Deleted

## 2017-04-25 ENCOUNTER — Emergency Department: Payer: BLUE CROSS/BLUE SHIELD

## 2017-04-25 DIAGNOSIS — I10 Essential (primary) hypertension: Secondary | ICD-10-CM | POA: Insufficient documentation

## 2017-04-25 DIAGNOSIS — J069 Acute upper respiratory infection, unspecified: Secondary | ICD-10-CM | POA: Diagnosis not present

## 2017-04-25 DIAGNOSIS — R05 Cough: Secondary | ICD-10-CM | POA: Diagnosis present

## 2017-04-25 DIAGNOSIS — R42 Dizziness and giddiness: Secondary | ICD-10-CM | POA: Insufficient documentation

## 2017-04-25 DIAGNOSIS — Z79899 Other long term (current) drug therapy: Secondary | ICD-10-CM | POA: Diagnosis not present

## 2017-04-25 LAB — BASIC METABOLIC PANEL
ANION GAP: 14 (ref 5–15)
BUN: 14 mg/dL (ref 6–20)
CHLORIDE: 99 mmol/L — AB (ref 101–111)
CO2: 22 mmol/L (ref 22–32)
Calcium: 9.4 mg/dL (ref 8.9–10.3)
Creatinine, Ser: 0.73 mg/dL (ref 0.44–1.00)
GFR calc non Af Amer: >60 mL/min (ref >60)
Glucose, Bld: 97 mg/dL (ref 65–99)
Potassium: 3.6 mmol/L (ref 3.5–5.1)
Sodium: 135 mmol/L (ref 135–145)

## 2017-04-25 LAB — CBC
HCT: 40.2 % (ref 35.0–47.0)
HEMOGLOBIN: 13.2 g/dL (ref 12.0–16.0)
MCH: 29.9 pg (ref 26.0–34.0)
MCHC: 33 g/dL (ref 32.0–36.0)
MCV: 90.7 fL (ref 80.0–100.0)
Platelets: 286 10*3/uL (ref 150–440)
RBC: 4.43 MIL/uL (ref 3.80–5.20)
RDW: 14 % (ref 11.5–14.5)
WBC: 4 10*3/uL (ref 3.6–11.0)

## 2017-04-25 LAB — TROPONIN I: Troponin I: <0.03 ng/mL (ref <0.03)

## 2017-04-25 MED ORDER — GUAIFENESIN-CODEINE 100-10 MG/5ML PO SYRP: 5.0000 mL | ORAL_SOLUTION | Freq: Three times a day (TID) | ORAL | 0 refills | Status: DC | PRN

## 2017-04-25 MED ORDER — BENZONATATE 100 MG PO CAPS
200.0000 mg | ORAL_CAPSULE | Freq: Once | ORAL | Status: AC
  Administered 2017-04-25: 200 mg via ORAL
  Filled 2017-04-25: qty 2

## 2017-04-25 NOTE — ED Triage Notes (Signed)
Pt ambulatory to triage.  Pt has a cough and dizziness. Sx for 3days.  Dizziness began today.   Pt also has nausea.  No chest pain. No sob.  Pt alert.

## 2017-04-25 NOTE — ED Notes (Signed)

## 2017-04-25 NOTE — ED Provider Notes (Signed)
[facility] Emergency Department Provider Note  ___________________________________________   First MD Initiated Contact with Patient 04/25/17 2043     (approximate)  I have reviewed the triage vital signs and the nursing notes.   HISTORY  Chief Complaint Cough and Dizziness   HPI Shelley Flowers is a 57 y.o. female presents to the emergency department for evaluation and treatment of cough for the past 3 days.  She states that she did not feel dizziness until after taking Mucinex earlier today.  She states that she has a history of high blood pressure but does not have a blood pressure monitor at home.  She states that because of the dizziness, she was worried that she might be having signs of stroke symptoms which prompted her to come to the emergency department.  She states that the cough is preventing her from sleeping.  She denies fever, sore throat, earache, nausea, vomiting, or diarrhea.  No known influenza exposures. Dizziness has since resolved without any intervention.   Past Medical History:  Diagnosis Date  . Hypertension     There are no active problems to display for this patient.   Past Surgical History:  Procedure Laterality Date  . CESAREAN SECTION      Prior to Admission medications   Medication Sig Start Date End Date Taking? Authorizing Provider  dicyclomine (BENTYL) 10 MG capsule Take 1 capsule (10 mg total) by mouth 3 (three) times daily as needed for up to 14 days for spasms. 03/15/17 03/29/17  Willy Eddy, MD  guaiFENesin-codeine [facility]) 100-10 MG/5ML syrup Take 5 mLs by mouth 3 (three) times daily as needed for cough. 04/25/17   Arion Morgan B, FNP  hydrochlorothiazide (HYDRODIURIL) 25 MG tablet Take 25 mg by mouth daily.    [provider]  ondansetron (ZOFRAN) 4 MG tablet Take 1 tablet (4 mg total) by mouth daily as needed for nausea or vomiting. 02/03/15   Jene Every, MD  oxyCODONE-acetaminophen  (PERCOCET) 7.5-325 MG tablet Take 1 tablet by mouth every 6 (six) hours as needed for severe pain. 08/01/16   Joni Reining, PA-C  promethazine (PHENERGAN) 12.5 MG tablet Take 2 tablets (25 mg total) by mouth every 6 (six) hours as needed for nausea or vomiting. 04/15/16   Enid Derry, PA-C    Allergies Patient has no known allergies.  No family history on file.  Social History Social History   Tobacco Use  . Smoking status: Never Smoker  . Smokeless tobacco: Never Used  Substance Use Topics  . Alcohol use: No  . Drug use: No    Review of Systems  Constitutional: No fever/chills Eyes: No visual changes. ENT: No sore throat. Cardiovascular: Denies chest pain. Respiratory: Denies shortness of breath.  Positive for cough Gastrointestinal: No abdominal pain.  No nausea, no vomiting.  No diarrhea.   Genitourinary: Negative for dysuria. Musculoskeletal: Negative for back pain.  Negative for body aches Skin: Negative for rash. Neurological: Negative for headaches, focal weakness or numbness.  Positive for dizziness  ____________________________________________   PHYSICAL EXAM:  VITAL SIGNS: ED Triage Vitals  Enc Vitals Group     BP 04/25/17 1842 115/67     Pulse Rate 04/25/17 1842 73     Resp 04/25/17 1842 20     Temp 04/25/17 1842 98.4 F (36.9 C)     Temp Source 04/25/17 1842 Oral     SpO2 04/25/17 1842 97 %     Weight 04/25/17 1843 150 lb (68  kg)     Height 04/25/17 1843 5' (1.524 m)     Head Circumference --      Peak Flow --      Pain Score 04/25/17 1843 5     Pain Loc --      Pain Edu? --      Excl. in GC? --     Constitutional: Alert and oriented. Well appearing and in no acute distress. Eyes: Conjunctivae are normal. Head: Atraumatic. Nose: Maxillary sinus congestion is noted.  No rhinorrhea Mouth/Throat: Mucous membranes are moist.  Oropharynx non-erythematous. Neck: No stridor.   Hematological/Lymphatic/Immunilogical: No cervical  lymphadenopathy. Cardiovascular: Normal rate, regular rhythm. Grossly normal heart sounds.  Good peripheral circulation. Respiratory: Normal respiratory effort.  No retractions. Lungs CTAB. Gastrointestinal: Soft and nontender. No distention. No abdominal bruits. No CVA tenderness. Musculoskeletal: No lower extremity tenderness nor edema.  No joint effusions. Neurologic:  Normal speech and language. No gross focal neurologic deficits are appreciated. No gait instability. Skin:  Skin is warm, dry and intact. No rash noted. Psychiatric: Mood and affect are normal. Speech and behavior are normal.  ____________________________________________   LABS (all labs ordered are listed, but only abnormal results are displayed)  Labs Reviewed  BASIC METABOLIC PANEL - Abnormal; Notable for the following components:      Result Value   Chloride 99 (*)    All other components within normal limits  CBC  TROPONIN I   ____________________________________________  EKG  Sinus rhythm without ectopy ____________________________________________  RADIOLOGY  ED MD interpretation:  Chest x-ray negative for acute cardiopulmonary abnormality. I, Kem Boroughsari Tunis Gentle, personally viewed and evaluated these images (plain radiographs) as part of my medical decision making, as well as reviewing the written report by the radiologist.   Official radiology report(s): Dg Chest 2 View  Result Date: 04/25/2017 CLINICAL DATA:  Cough and dizziness for the past 3 days. EXAM: CHEST  2 VIEW COMPARISON:  Chest x-ray dated June 16, 2016. FINDINGS: The heart size and mediastinal contours are within normal limits. Both lungs are clear. The visualized skeletal structures are unremarkable. IMPRESSION: No active cardiopulmonary disease. Electronically Signed   By: Obie DredgeWilliam T Derry M.D.   On: 04/25/2017 19:16    ____________________________________________   PROCEDURES  Procedure(s) performed: None  Procedures  Critical Care  performed: No  ____________________________________________   INITIAL IMPRESSION / ASSESSMENT AND PLAN / ED COURSE  As part of my medical decision making, I reviewed the following data within the electronic MEDICAL RECORD NUMBER    57 year old female presenting to the emergency department for treatment and evaluation of cough and dizziness.  Dizziness had resolved prior to my evaluation.  Symptoms and exam are most concerned viral upper respiratory infection.  Patient was encouraged to avoid taking Mucinex since this is likely the cause of the dizziness that she experienced earlier today.  She will be given a prescription for Robitussin [facility] and advised to follow-up with her primary care provider for symptoms that are not improving over the next few days.  She was encouraged to return to the emergency department for any symptoms of concern if she is unable to schedule an appointment.      ____________________________________________   FINAL CLINICAL IMPRESSION(S) / ED DIAGNOSES  Final diagnoses:  Viral upper respiratory tract infection     ED Discharge Orders        Ordered    guaiFENesin-codeine [facility](ROBITUSSIN AC) 100-10 MG/5ML syrup  3 times daily PRN     04/25/17 2114  Note:  This document was prepared using Dragon voice recognition software and may include unintentional dictation errors.    Chinita Pester, FNP 04/25/17 2333    Willy Eddy, MD 04/26/17 [phone]

## 2017-05-07 ENCOUNTER — Other Ambulatory Visit: Payer: Self-pay | Admitting: Family Medicine

## 2017-05-07 DIAGNOSIS — M545 Low back pain: Secondary | ICD-10-CM

## 2017-05-08 ENCOUNTER — Ambulatory Visit
Admission: RE | Admit: 2017-05-08 | Discharge: 2017-05-08 | Disposition: A | Discharge status: Home / Self Care | Payer: BLUE CROSS/BLUE SHIELD | Source: Ambulatory Visit | Attending: Family Medicine | Admitting: Family Medicine

## 2017-05-08 DIAGNOSIS — M545 Low back pain: Secondary | ICD-10-CM | POA: Diagnosis present

## 2017-05-08 DIAGNOSIS — M419 Scoliosis, unspecified: Secondary | ICD-10-CM | POA: Insufficient documentation

## 2017-05-24 ENCOUNTER — Encounter: Payer: Self-pay | Admitting: Emergency Medicine

## 2017-05-24 ENCOUNTER — Emergency Department
Admission: EM | Admit: 2017-05-24 | Discharge: 2017-05-24 | Disposition: A | Discharge status: Home / Self Care | Payer: BLUE CROSS/BLUE SHIELD | Attending: Emergency Medicine | Admitting: Emergency Medicine

## 2017-05-24 DIAGNOSIS — I10 Essential (primary) hypertension: Secondary | ICD-10-CM | POA: Insufficient documentation

## 2017-05-24 DIAGNOSIS — M5137 Other intervertebral disc degeneration, lumbosacral region: Secondary | ICD-10-CM | POA: Diagnosis not present

## 2017-05-24 DIAGNOSIS — M5432 Sciatica, left side: Secondary | ICD-10-CM | POA: Diagnosis not present

## 2017-05-24 DIAGNOSIS — Z79899 Other long term (current) drug therapy: Secondary | ICD-10-CM | POA: Insufficient documentation

## 2017-05-24 DIAGNOSIS — M79605 Pain in left leg: Secondary | ICD-10-CM | POA: Diagnosis present

## 2017-05-24 MED ORDER — KETOROLAC TROMETHAMINE 10 MG PO TABS: 10.0000 mg | ORAL_TABLET | Freq: Three times a day (TID) | ORAL | 0 refills | Status: DC

## 2017-05-24 MED ORDER — CYCLOBENZAPRINE HCL 5 MG PO TABS: 5.0000 mg | ORAL_TABLET | Freq: Three times a day (TID) | ORAL | 0 refills | Status: DC | PRN

## 2017-05-24 MED ORDER — DICLOFENAC SODIUM 50 MG PO TBEC: 50.0000 mg | DELAYED_RELEASE_TABLET | Freq: Two times a day (BID) | ORAL | 0 refills | Status: DC

## 2017-05-24 MED ORDER — KETOROLAC TROMETHAMINE 30 MG/ML IJ SOLN
30.0000 mg | Freq: Once | INTRAMUSCULAR | Status: AC
Start: 2017-05-24 — End: 2017-05-24
  Administered 2017-05-24: 30 mg via INTRAMUSCULAR
  Filled 2017-05-24: qty 1

## 2017-05-24 NOTE — ED Provider Notes (Signed)
[facility] Emergency Department Provider Note ____________________________________________  Time seen: 1120  I have reviewed the triage vital signs and the nursing notes.  HISTORY  Chief Complaint  Leg Pain  HPI Shelley Flowers is a 57 y.o. female presents herself to the ED from home, for evaluation of pain from her left buttocks to her left foot.  Patient with a diagnosis left sciatica about 5 weeks earlier, notes ongoing symptoms.  She describes her PCP placed her on gabapentin and meloxicam.  She reports the gabapentin causes drowsiness that she is only able to dose it at nighttime.  She does take daily meloxicam but does not note any significant benefit.  She reports that she had a steroid injection and a steroid taper pack about 4 weeks prior.  She presents now with ongoing and worsening left sciatic irritation.  She denies any distal paresthesias, footdrop, or incontinence.  She does note some weakness with attempting to raise her foot and toes on the left side.  She denies any interim injury, accident, fall.  Past Medical History:  Diagnosis Date  . Hypertension     There are no active problems to display for this patient.   Past Surgical History:  Procedure Laterality Date  . CESAREAN SECTION      Prior to Admission medications   Medication Sig Start Date End Date Taking? Authorizing Provider  cyclobenzaprine (FLEXERIL) 5 MG tablet Take 1 tablet (5 mg total) by mouth 3 (three) times daily as needed for muscle spasms. 05/24/17   Lizbett Garciagarcia, Charlesetta IvoryJenise V Bacon, PA-C  diclofenac (VOLTAREN) 50 MG EC tablet Take 1 tablet (50 mg total) by mouth 2 (two) times daily. 05/24/17   Lawan Nanez, Charlesetta IvoryJenise V Bacon, PA-C  dicyclomine (BENTYL) 10 MG capsule Take 1 capsule (10 mg total) by mouth 3 (three) times daily as needed for up to 14 days for spasms. 03/15/17 03/29/17  Willy Eddyobinson, Patrick, MD  guaiFENesin-codeine [facility](ROBITUSSIN AC) 100-10 MG/5ML syrup Take 5 mLs by mouth 3 (three) times  daily as needed for cough. 04/25/17   Triplett, Cari B, FNP  hydrochlorothiazide (HYDRODIURIL) 25 MG tablet Take 25 mg by mouth daily.    [provider]  ketorolac (TORADOL) 10 MG tablet Take 1 tablet (10 mg total) by mouth every 8 (eight) hours. 05/24/17   Devynn Scheff, Charlesetta IvoryJenise V Bacon, PA-C  ondansetron (ZOFRAN) 4 MG tablet Take 1 tablet (4 mg total) by mouth daily as needed for nausea or vomiting. 02/03/15   Jene EveryKinner, Robert, MD  oxyCODONE-acetaminophen (PERCOCET) 7.5-325 MG tablet Take 1 tablet by mouth every 6 (six) hours as needed for severe pain. 08/01/16   Joni ReiningSmith, Ronald K, PA-C  promethazine (PHENERGAN) 12.5 MG tablet Take 2 tablets (25 mg total) by mouth every 6 (six) hours as needed for nausea or vomiting. 04/15/16   Enid DerryWagner, Ashley, PA-C    Allergies Patient has no known allergies.  No family history on file.  Social History Social History   Tobacco Use  . Smoking status: Never Smoker  . Smokeless tobacco: Never Used  Substance Use Topics  . Alcohol use: No  . Drug use: No    Review of Systems  Constitutional: Negative for fever. Cardiovascular: Negative for chest pain. Respiratory: Negative for shortness of breath. Gastrointestinal: Negative for abdominal pain, vomiting and diarrhea. Genitourinary: Negative for dysuria. Musculoskeletal: Positive for left lower extremity paresthesias as above.. Skin: Negative for rash. Neurological: Negative for headaches, focal weakness or numbness. ____________________________________________  PHYSICAL EXAM:  VITAL SIGNS: ED Triage Vitals  Enc Vitals Group     BP 05/24/17 1037 (!) 146/88     Pulse Rate 05/24/17 1037 77     Resp 05/24/17 1037 18     Temp 05/24/17 1037 99.5 F (37.5 C)     Temp Source 05/24/17 1037 Oral     SpO2 05/24/17 1037 99 %     Weight 05/24/17 1038 150 lb (68 kg)     Height 05/24/17 1038 5' (1.524 m)     Head Circumference --      Peak Flow --      Pain Score 05/24/17 1037 10     Pain Loc --       Pain Edu? --      Excl. in GC? --     Constitutional: Alert and oriented. Well appearing and in no distress. Head: Normocephalic and atraumatic. Cardiovascular: Normal rate, regular rhythm. Normal distal pulses. Respiratory: Normal respiratory effort. No wheezes/rales/rhonchi. Gastrointestinal: Soft and nontender. No distention. Musculoskeletal: Normal spinal alignment without midline tenderness, spasm, deformity, or step-off. Nontender with normal range of motion in all extremities.  Neurologic: Cranial nerves II through XII grossly intact.  LE DTRs bilaterally.  Slightly decreased left toe dorsiflexion on exam.  Patient with a negative supine straight leg raise.  Normal gait without ataxia. Normal speech and language. No gross focal neurologic deficits are appreciated. ____________________________________________  PROCEDURES  Procedures Toradol 30 mg IM ____________________________________________  INITIAL IMPRESSION / ASSESSMENT AND PLAN / ED COURSE  Patient with ED evaluation of left sciatic nerve irritation.  Patient's symptoms are consistent with an acute left sciatic nerve irritation.  She has had little response her previously prescribed medications.  Discussed the options for treatment after reviewing her lumbar films.  The patient is agreeable to try a short course of ketorolac followed by daily diclofenac in lieu of her meloxicam.  She is advised to continue dosing of gabapentin at night until, or if, she can increase the dose during the day without daytime drowsiness.  A prescription for Flexeril was also provided to dose at night for muscle spasm and pain relief.  Patient will follow up with her PCP for ongoing management of symptoms.  A work note is provided for today as requested.  Return precautions have been reviewed. ____________________________________________  FINAL CLINICAL IMPRESSION(S) / ED DIAGNOSES  Final diagnoses:  Sciatica of left side  DDD (degenerative disc  disease), lumbosacral      Karmen Stabs, Charlesetta Ivory, PA-C 05/24/17 1941    Governor Rooks, MD 05/25/17 1119

## 2017-05-24 NOTE — Discharge Instructions (Signed)
Your exam and previous x-rays are consistent with sciatica. Take the new prescription meds as directed. Follow-up with Franco Nonesheryl Lindley for continued symptoms. Return to the ED as needed.

## 2017-05-24 NOTE — ED Triage Notes (Signed)
Patient presents to the ED with pain from "left buttocks to left foot".  Patient states she has had pain for 5 weeks.  Her PCP diagnosed her with sciatica and prescribed gabapentin.  Patient states medication is not helping the pain and now she is having increased difficulty sleeping.

## 2018-08-03 ENCOUNTER — Encounter: Payer: Self-pay | Admitting: Intensive Care

## 2018-08-03 ENCOUNTER — Emergency Department: Payer: BLUE CROSS/BLUE SHIELD

## 2018-08-03 ENCOUNTER — Other Ambulatory Visit: Payer: Self-pay

## 2018-08-03 ENCOUNTER — Emergency Department
Admission: EM | Admit: 2018-08-03 | Discharge: 2018-08-03 | Disposition: A | Discharge status: Home / Self Care | Payer: BLUE CROSS/BLUE SHIELD | Attending: Emergency Medicine | Admitting: Emergency Medicine

## 2018-08-03 DIAGNOSIS — I10 Essential (primary) hypertension: Secondary | ICD-10-CM | POA: Insufficient documentation

## 2018-08-03 DIAGNOSIS — R142 Eructation: Secondary | ICD-10-CM | POA: Insufficient documentation

## 2018-08-03 DIAGNOSIS — R197 Diarrhea, unspecified: Secondary | ICD-10-CM | POA: Insufficient documentation

## 2018-08-03 DIAGNOSIS — Z79899 Other long term (current) drug therapy: Secondary | ICD-10-CM | POA: Diagnosis not present

## 2018-08-03 DIAGNOSIS — R079 Chest pain, unspecified: Secondary | ICD-10-CM

## 2018-08-03 DIAGNOSIS — R110 Nausea: Secondary | ICD-10-CM | POA: Insufficient documentation

## 2018-08-03 LAB — CBC
HCT: 39.5 % (ref 36.0–46.0)
Hemoglobin: 12.3 g/dL (ref 12.0–15.0)
MCH: 29.2 pg (ref 26.0–34.0)
MCHC: 31.1 g/dL (ref 30.0–36.0)
MCV: 93.8 fL (ref 80.0–100.0)
Platelets: 293 10*3/uL (ref 150–400)
RBC: 4.21 MIL/uL (ref 3.87–5.11)
RDW: 13.2 % (ref 11.5–15.5)
WBC: 4.7 10*3/uL (ref 4.0–10.5)
nRBC: 0 % (ref 0.0–0.2)

## 2018-08-03 LAB — BASIC METABOLIC PANEL
Anion gap: 8 (ref 5–15)
BUN: 13 mg/dL (ref 6–20)
CO2: 25 mmol/L (ref 22–32)
Calcium: 9.2 mg/dL (ref 8.9–10.3)
Chloride: 105 mmol/L (ref 98–111)
Creatinine, Ser: 0.69 mg/dL (ref 0.44–1.00)
GFR calc Af Amer: >60 mL/min (ref >60)
GFR calc non Af Amer: >60 mL/min (ref >60)
Glucose, Bld: 104 mg/dL — ABNORMAL HIGH (ref 70–99)
Potassium: 3.5 mmol/L (ref 3.5–5.1)
Sodium: 138 mmol/L (ref 135–145)

## 2018-08-03 LAB — TROPONIN I: Troponin I: <0.03 ng/mL (ref <0.03)

## 2018-08-03 LAB — GLUCOSE, CAPILLARY: Glucose-Capillary: 101 mg/dL — ABNORMAL HIGH (ref 70–99)

## 2018-08-03 MED ORDER — ONDANSETRON 4 MG PO TBDP: 4.0000 mg | ORAL_TABLET | Freq: Three times a day (TID) | ORAL | 0 refills | Status: DC | PRN

## 2018-08-03 MED ORDER — ALUM & MAG HYDROXIDE-SIMETH 200-200-20 MG/5ML PO SUSP
30.0000 mL | Freq: Once | ORAL | Status: AC
  Administered 2018-08-03: 30 mL via ORAL
  Filled 2018-08-03: qty 30

## 2018-08-03 MED ORDER — LIDOCAINE VISCOUS HCL 2 % MT SOLN
15.0000 mL | Freq: Once | OROMUCOSAL | Status: AC
  Administered 2018-08-03: 19:00:00 15 mL via ORAL
  Filled 2018-08-03: qty 15

## 2018-08-03 MED ORDER — ONDANSETRON 4 MG PO TBDP
4.0000 mg | ORAL_TABLET | Freq: Once | ORAL | Status: AC
  Administered 2018-08-03: 4 mg via ORAL
  Filled 2018-08-03: qty 1

## 2018-08-03 NOTE — ED Triage Notes (Signed)
Patient c/o shooting pain through center of chest around noon today with nausea. Reports she thought it may have been acid reflux. Patient reports pain is gone now but still has nausea

## 2018-08-03 NOTE — ED Notes (Signed)
Urine sent to the lab

## 2018-08-03 NOTE — ED Notes (Signed)
ED Provider at bedside. 

## 2018-08-03 NOTE — ED Provider Notes (Signed)
[facility] Emergency Department Provider Note  Time seen: 6:37 PM  I have reviewed the triage vital signs and the nursing notes.   HISTORY  Chief Complaint Chest Pain   HPI Shelley Flowers is a 58 y.o. female with a past medical history of hypertension presents to the emergency department for chest pain and nausea.  According to the patient around 12 PM today she had one episode of sharp pain in the center of her chest.  States it is very brief however over the next hour or 2 she began experiencing nausea with excessive amounts of belching.  Patient states she had one episode of diarrhea continued to feel nauseated so she came to the emergency department for evaluation.  No chest pain at this time.  No shortness of breath cough or fever at any point.  Denies any abdominal pain.   Past Medical History:  Diagnosis Date  . Hypertension     There are no active problems to display for this patient.   Past Surgical History:  Procedure Laterality Date  . CESAREAN SECTION      Prior to Admission medications   Medication Sig Start Date End Date Taking? Authorizing Provider  cyclobenzaprine (FLEXERIL) 5 MG tablet Take 1 tablet (5 mg total) by mouth 3 (three) times daily as needed for muscle spasms. 05/24/17   Menshew, Charlesetta IvoryJenise V Bacon, PA-C  diclofenac (VOLTAREN) 50 MG EC tablet Take 1 tablet (50 mg total) by mouth 2 (two) times daily. 05/24/17   Menshew, Charlesetta IvoryJenise V Bacon, PA-C  dicyclomine (BENTYL) 10 MG capsule Take 1 capsule (10 mg total) by mouth 3 (three) times daily as needed for up to 14 days for spasms. 03/15/17 03/29/17  Willy Eddyobinson, Patrick, MD  guaiFENesin-codeine [facility](ROBITUSSIN AC) 100-10 MG/5ML syrup Take 5 mLs by mouth 3 (three) times daily as needed for cough. 04/25/17   Triplett, Cari B, FNP  hydrochlorothiazide (HYDRODIURIL) 25 MG tablet Take 25 mg by mouth daily.    [provider]  ketorolac (TORADOL) 10 MG tablet Take 1 tablet (10 mg total) by mouth  every 8 (eight) hours. 05/24/17   Menshew, Charlesetta IvoryJenise V Bacon, PA-C  ondansetron (ZOFRAN) 4 MG tablet Take 1 tablet (4 mg total) by mouth daily as needed for nausea or vomiting. 02/03/15   Jene EveryKinner, Robert, MD  oxyCODONE-acetaminophen (PERCOCET) 7.5-325 MG tablet Take 1 tablet by mouth every 6 (six) hours as needed for severe pain. 08/01/16   Joni ReiningSmith, Ronald K, PA-C  promethazine (PHENERGAN) 12.5 MG tablet Take 2 tablets (25 mg total) by mouth every 6 (six) hours as needed for nausea or vomiting. 04/15/16   Enid DerryWagner, Ashley, PA-C    No Known Allergies  History reviewed. No pertinent family history.  Social History Social History   Tobacco Use  . Smoking status: Never Smoker  . Smokeless tobacco: Never Used  Substance Use Topics  . Alcohol use: No  . Drug use: No    Review of Systems Constitutional: Negative for fever. ENT: Negative for recent illness/congestion Cardiovascular: Negative for chest pain. Respiratory: Negative for shortness of breath. Gastrointestinal: Negative for abdominal pain.  One episode of diarrhea.  Positive for nausea but no vomiting. Genitourinary: Negative for urinary compaints Musculoskeletal: Negative for musculoskeletal complaints Neurological: Negative for headache All other ROS negative  ____________________________________________   PHYSICAL EXAM:  VITAL SIGNS: ED Triage Vitals  Enc Vitals Group     BP 08/03/18 1700 139/82     Pulse Rate 08/03/18 1700 71  Resp 08/03/18 1700 20     Temp 08/03/18 1700 98.3 F (36.8 C)     Temp Source 08/03/18 1700 Oral     SpO2 08/03/18 1700 100 %     Weight 08/03/18 1701 150 lb (68 kg)     Height 08/03/18 1701 5' (1.524 m)     Head Circumference --      Peak Flow --      Pain Score 08/03/18 1704 0     Pain Loc --      Pain Edu? --      Excl. in GC? --    Constitutional: Alert and oriented. Well appearing and in no distress. Eyes: Normal exam ENT      Head: Normocephalic and atraumatic.      Mouth/Throat:  Mucous membranes are moist. Cardiovascular: Normal rate, regular rhythm.  Respiratory: Normal respiratory effort without tachypnea nor retractions. Breath sounds are clear Gastrointestinal: Soft and nontender. No distention.   Musculoskeletal: Nontender with normal range of motion in all extremities. Neurologic:  Normal speech and language. No gross focal neurologic deficits Skin:  Skin is warm, dry and intact.  Psychiatric: Mood and affect are normal. ____________________________________________    EKG  EKG viewed and interpreted by myself shows a normal sinus rhythm at 74 bpm with a narrow QRS, normal axis, normal intervals, no concerning ST changes.  ____________________________________________    RADIOLOGY  This x-ray is negative  ____________________________________________   INITIAL IMPRESSION / ASSESSMENT AND PLAN / ED COURSE  Pertinent labs & imaging results that were available during my care of the patient were reviewed by me and considered in my medical decision making (see chart for details).   Patient presents emergency department for chest pain, sudden onset, now resolved.  Continues to have nausea and belching with one episode of diarrhea.  Differential would include ACS, gastroenteritis, enteritis, gastric distention.  Overall the patient appears extremely well has no complaints at this time besides feeling nauseated.  We will dose a GI cocktail given the patient's history of heartburn, we will dose Zofran for nausea and continue to closely monitor.  Patient agreeable to plan of care.  Chest x-ray negative.  Work-up is reassuring including negative troponin.  Patient feels better.  We will discharge with Zofran and PCP follow-up.  Patient agreeable plan of care.  Discussed my normal chest pain return precautions.  Shelley Flowers was evaluated in Emergency Department on 08/03/2018 for the symptoms described in the history of present illness. She was evaluated in the  context of the global COVID-19 pandemic, which necessitated consideration that the patient might be at risk for infection with the SARS-CoV-2 virus that causes COVID-19. Institutional protocols and algorithms that pertain to the evaluation of patients at risk for COVID-19 are in a state of rapid change based on information released by regulatory bodies including the CDC and federal and state organizations. These policies and algorithms were followed during the patient's care in the ED.  ____________________________________________   FINAL CLINICAL IMPRESSION(S) / ED DIAGNOSES  Nausea Chest pain   Minna Antis, MD 08/03/18 1925

## 2019-04-19 ENCOUNTER — Encounter: Payer: Self-pay | Admitting: Emergency Medicine

## 2019-04-19 ENCOUNTER — Other Ambulatory Visit: Payer: Self-pay

## 2019-04-19 ENCOUNTER — Emergency Department
Admission: EM | Admit: 2019-04-19 | Discharge: 2019-04-20 | Disposition: A | Discharge status: Home / Self Care | Payer: BC Managed Care – PPO | Attending: Emergency Medicine | Admitting: Emergency Medicine

## 2019-04-19 ENCOUNTER — Emergency Department: Payer: BC Managed Care – PPO

## 2019-04-19 DIAGNOSIS — I10 Essential (primary) hypertension: Secondary | ICD-10-CM | POA: Diagnosis not present

## 2019-04-19 DIAGNOSIS — Z79899 Other long term (current) drug therapy: Secondary | ICD-10-CM | POA: Diagnosis not present

## 2019-04-19 DIAGNOSIS — R0789 Other chest pain: Secondary | ICD-10-CM | POA: Insufficient documentation

## 2019-04-19 DIAGNOSIS — R079 Chest pain, unspecified: Secondary | ICD-10-CM | POA: Diagnosis present

## 2019-04-19 LAB — CBC
HCT: 38.7 % (ref 36.0–46.0)
Hemoglobin: 12.6 g/dL (ref 12.0–15.0)
MCH: 29.9 pg (ref 26.0–34.0)
MCHC: 32.6 g/dL (ref 30.0–36.0)
MCV: 91.7 fL (ref 80.0–100.0)
Platelets: 309 10*3/uL (ref 150–400)
RBC: 4.22 MIL/uL (ref 3.87–5.11)
RDW: 13.3 % (ref 11.5–15.5)
WBC: 8.1 10*3/uL (ref 4.0–10.5)
nRBC: 0 % (ref 0.0–0.2)

## 2019-04-19 LAB — BASIC METABOLIC PANEL
Anion gap: 10 (ref 5–15)
BUN: 13 mg/dL (ref 6–20)
CO2: 26 mmol/L (ref 22–32)
Calcium: 9.4 mg/dL (ref 8.9–10.3)
Chloride: 104 mmol/L (ref 98–111)
Creatinine, Ser: 0.8 mg/dL (ref 0.44–1.00)
GFR calc Af Amer: >60 mL/min (ref >60)
GFR calc non Af Amer: >60 mL/min (ref >60)
Glucose, Bld: 119 mg/dL — ABNORMAL HIGH (ref 70–99)
Potassium: 3.5 mmol/L (ref 3.5–5.1)
Sodium: 140 mmol/L (ref 135–145)

## 2019-04-19 LAB — TROPONIN I (HIGH SENSITIVITY): Troponin I (High Sensitivity): <2 ng/L (ref <18)

## 2019-04-19 MED ORDER — LIDOCAINE VISCOUS HCL 2 % MT SOLN
15.0000 mL | Freq: Once | OROMUCOSAL | Status: AC
  Administered 2019-04-19: 15 mL via ORAL
  Filled 2019-04-19: qty 15

## 2019-04-19 MED ORDER — FAMOTIDINE IN NACL 20-0.9 MG/50ML-% IV SOLN
20.0000 mg | Freq: Once | INTRAVENOUS | Status: AC
  Administered 2019-04-19: 20 mg via INTRAVENOUS
  Filled 2019-04-19: qty 50

## 2019-04-19 MED ORDER — ACETAMINOPHEN 500 MG PO TABS
1000.0000 mg | ORAL_TABLET | Freq: Once | ORAL | Status: AC
  Administered 2019-04-19: 1000 mg via ORAL
  Filled 2019-04-19: qty 2

## 2019-04-19 MED ORDER — ONDANSETRON HCL 4 MG/2ML IJ SOLN
4.0000 mg | Freq: Once | INTRAMUSCULAR | Status: AC
  Administered 2019-04-19: 4 mg via INTRAVENOUS
  Filled 2019-04-19: qty 2

## 2019-04-19 MED ORDER — ALUM & MAG HYDROXIDE-SIMETH 200-200-20 MG/5ML PO SUSP
30.0000 mL | Freq: Once | ORAL | Status: AC
  Administered 2019-04-19: 30 mL via ORAL
  Filled 2019-04-19: qty 30

## 2019-04-19 MED ORDER — SODIUM CHLORIDE 0.9% FLUSH: 3.0000 mL | Freq: Once | INTRAVENOUS | Status: DC

## 2019-04-19 NOTE — ED Triage Notes (Signed)
Pt to ED from home c/o central chest pressure that started Saturday but hasn't gone away, denies pain radiating, some nausea last night but no vomiting or diarrhea.  Hurts worse with movement.  Presents A&Ox4, chest rise even and unlabored, in NAD at this time.

## 2019-04-19 NOTE — ED Provider Notes (Signed)
[facility] Emergency Department Provider Note  ____________________________________________  Time seen: Approximately 11:58 PM  I have reviewed the triage vital signs and the nursing notes.   HISTORY  Chief Complaint No chief complaint on file.   HPI Shelley Flowers is a 59 y.o. female the history of hypertension who presents for evaluation of chest pain.  Patient reports that her symptoms have been present for 2 days.  She describes as a pressure in the center of her chest that is usually present with movement of her torso but goes away when she sits down or lays flat.  She also reports that burping makes the pressure go away.  She took some over-the-counter gas medication with no significant relief.  She denies shortness of breath, cough, dizziness, or fever.  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  She reports that the pain started while she was doing laundry 2 days ago at home.   She denies history of GERD or peptic ulcer disease.  She does report taking Excedrin on a daily basis for migraine headaches.  She denies any vomiting, nausea, diarrhea, constipation, abdominal pain.  No personal or family history of heart attacks, no history of smoking, no history of alcohol use, no personal or family history of blood clots, no recent travel immobilization, no leg pain or swelling, no hemoptysis or exogenous hormones.  The pain does not radiate to her back, no numbness, tingling, or weakness of her extremities.  Past Medical History:  Diagnosis Date  . Hypertension     There are no problems to display for this patient.   Past Surgical History:  Procedure Laterality Date  . CESAREAN SECTION      Prior to Admission medications   Medication Sig Start Date End Date Taking? Authorizing Provider  olmesartan-hydrochlorothiazide (BENICAR HCT) 40-25 MG tablet Take 1 tablet by mouth daily.  03/04/19  Yes [provider]  omeprazole (PRILOSEC OTC) 20 MG tablet Take 20 mg by mouth daily.   Yes [provider]    Allergies Patient has no known allergies.  History reviewed. No pertinent family history.  Social History Social History   Tobacco Use  . Smoking status: Never Smoker  . Smokeless tobacco: Never Used  Substance Use Topics  . Alcohol use: No  . Drug use: No    Review of Systems  Constitutional: Negative for fever. Eyes: Negative for visual changes. ENT: Negative for sore throat. Neck: No neck pain  Cardiovascular: + chest pressure Respiratory: Negative for shortness of breath. Gastrointestinal: Negative for abdominal pain, vomiting or diarrhea. Genitourinary: Negative for dysuria. Musculoskeletal: Negative for back pain. Skin: Negative for rash. Neurological: Negative for headaches, weakness or numbness. Psych: No SI or HI  ____________________________________________   PHYSICAL EXAM:  VITAL SIGNS: ED Triage Vitals  Enc Vitals Group     BP 04/19/19 2121 122/62     Pulse Rate 04/19/19 2121 85     Resp 04/19/19 2121 14     Temp 04/19/19 2121 97.9 F (36.6 C)     Temp Source 04/19/19 2121 Oral     SpO2 04/19/19 2121 98 %     Weight 04/19/19 2121 145 lb (65.8 kg)     Height 04/19/19 2121 5' (1.524 m)     Head Circumference --      Peak Flow --      Pain Score 04/19/19 2122 6  Pain Loc --      Pain Edu? --      Excl. in GC? --     Constitutional: Alert and oriented. Well appearing and in no apparent distress. HEENT:      Head: Normocephalic and atraumatic.         Eyes: Conjunctivae are normal. Sclera is non-icteric.       Mouth/Throat: Mucous membranes are moist.       Neck: Supple with no signs of meningismus. Cardiovascular: Regular rate and rhythm. No murmurs, gallops, or rubs. 2+ symmetrical distal pulses are present in all extremities. No JVD. Respiratory: Normal respiratory effort. Lungs are clear to  auscultation bilaterally. No wheezes, crackles, or rhonchi.  Gastrointestinal: Soft, non tender, and non distended with positive bowel sounds. No rebound or guarding. Musculoskeletal: Nontender with normal range of motion in all extremities. No edema, cyanosis, or erythema of extremities. Neurologic: Normal speech and language. Face is symmetric. Moving all extremities. No gross focal neurologic deficits are appreciated. Skin: Skin is warm, dry and intact. No rash noted. Psychiatric: Mood and affect are normal. Speech and behavior are normal.  ____________________________________________   LABS (all labs ordered are listed, but only abnormal results are displayed)  Labs Reviewed  BASIC METABOLIC PANEL - Abnormal; Notable for the following components:      Result Value   Glucose, Bld 119 (*)    All other components within normal limits  CBC  TROPONIN I (HIGH SENSITIVITY)  TROPONIN I (HIGH SENSITIVITY)   ____________________________________________  EKG  ED ECG REPORT I, Nita Sickle, the attending physician, personally viewed and interpreted this ECG.  Normal sinus rhythm, rate of 82, normal intervals, normal axis, no ST elevations or depressions.  EKG is unchanged from prior. ____________________________________________  RADIOLOGY  I have personally reviewed the images performed during this visit and I agree with the Radiologist's read.   Interpretation by Radiologist:  DG Chest 2 View  Result Date: 04/19/2019 CLINICAL DATA:  59 year old female with chest pain. EXAM: CHEST - 2 VIEW COMPARISON:  Chest radiograph dated 08/03/2018. FINDINGS: The heart size and mediastinal contours are within normal limits. Both lungs are clear. The visualized skeletal structures are unremarkable. IMPRESSION: No active cardiopulmonary disease. Electronically Signed   By: Elgie Collard M.D.   On: 04/19/2019 21:37      ____________________________________________   PROCEDURES  Procedure(s) performed: None Procedures Critical Care performed:  None ____________________________________________   INITIAL IMPRESSION / ASSESSMENT AND PLAN / ED COURSE  59 y.o. female the history of hypertension who presents for evaluation of chest pressure present only with movement of the torso, better when sitting down and when burping.  Patient is extremely well-appearing in no distress, she has normal vital signs, she has intact pulses which are strong and equal in all 4 extremities, there is no asymmetric leg swelling, normal skin, palpation of the chest wall does not reproduce the pain, lungs are clear to auscultation, heart regular rate and rhythm with no murmurs, no neuro deficits.  Ddx GERD, PUD, gastritis, MSK, pleurisy, costochondritis. Atypical for ACS however EKG and HS-troponin x 2 done to further stratified and negative. Doubt PE with atypical symptoms, no SOB, no tachycardia, tachypnea, or hypoxia. Aortic dissection considered, however there were with no typical symptoms of chest pain radiating through to intrascapular back, no severe hypertension, symmetric bilateral radial pulses, no associated neurologic deficits, no associated abdominal or lower extremity symptoms, no marfanoid features or evidence of underlying connective tissue disorder, and no evidence of mediastinal  widening on CXR.   Will give Pepcid, GI cocktail, and tylenol and reassess     _________________________ 1:00 AM on 04/20/2019 -----------------------------------------  Patient walked around in the room and has no further episodes of pain.  At this time will discharge home with follow-up with PCP.  Discussed my standard return precautions.  Presentation is most likely a component of MSK pain and indigestion.    As part of my medical decision making, I reviewed the following data within the [place] notes  reviewed and incorporated, Labs reviewed , EKG interpreted , Old chart reviewed, Radiograph reviewed , Notes from prior ED visits and [place] Controlled Substance Database   Please note:  Patient was evaluated in Emergency Department today for the symptoms described in the history of present illness. Patient was evaluated in the context of the global COVID-19 pandemic, which necessitated consideration that the patient might be at risk for infection with the SARS-CoV-2 virus that causes COVID-19. Institutional protocols and algorithms that pertain to the evaluation of patients at risk for COVID-19 are in a state of rapid change based on information released by regulatory bodies including the CDC and federal and state organizations. These policies and algorithms were followed during the patient's care in the ED.  Some ED evaluations and interventions may be delayed as a result of limited staffing during the pandemic.   ____________________________________________   FINAL CLINICAL IMPRESSION(S) / ED DIAGNOSES   Final diagnoses:  Atypical chest pain      NEW MEDICATIONS STARTED DURING THIS VISIT:  ED Discharge Orders    None       Note:  This document was prepared using Dragon voice recognition software and may include unintentional dictation errors.    Alfred Levins, [place], MD 04/20/19 0100

## 2019-04-20 LAB — TROPONIN I (HIGH SENSITIVITY): Troponin I (High Sensitivity): <2 ng/L (ref <18)

## 2019-04-20 MED ORDER — CYCLOBENZAPRINE HCL 10 MG PO TABS: 10.0000 mg | ORAL_TABLET | Freq: Three times a day (TID) | ORAL | 0 refills | Status: DC | PRN

## 2019-04-20 NOTE — Discharge Instructions (Addendum)

## 2020-08-06 ENCOUNTER — Emergency Department: Payer: BC Managed Care – PPO

## 2020-08-06 ENCOUNTER — Emergency Department
Admission: EM | Admit: 2020-08-06 | Discharge: 2020-08-07 | Disposition: A | Discharge status: Home / Self Care | Payer: BC Managed Care – PPO | Attending: Emergency Medicine | Admitting: Emergency Medicine

## 2020-08-06 ENCOUNTER — Other Ambulatory Visit: Payer: Self-pay

## 2020-08-06 DIAGNOSIS — R197 Diarrhea, unspecified: Secondary | ICD-10-CM | POA: Diagnosis not present

## 2020-08-06 DIAGNOSIS — R112 Nausea with vomiting, unspecified: Secondary | ICD-10-CM | POA: Diagnosis not present

## 2020-08-06 DIAGNOSIS — R1030 Lower abdominal pain, unspecified: Secondary | ICD-10-CM

## 2020-08-06 DIAGNOSIS — Z79899 Other long term (current) drug therapy: Secondary | ICD-10-CM | POA: Diagnosis not present

## 2020-08-06 DIAGNOSIS — I10 Essential (primary) hypertension: Secondary | ICD-10-CM | POA: Diagnosis not present

## 2020-08-06 DIAGNOSIS — R110 Nausea: Secondary | ICD-10-CM | POA: Diagnosis not present

## 2020-08-06 DIAGNOSIS — K5730 Diverticulosis of large intestine without perforation or abscess without bleeding: Secondary | ICD-10-CM | POA: Diagnosis not present

## 2020-08-06 HISTORY — DX: Gastro-esophageal reflux disease without esophagitis: K21.9

## 2020-08-06 LAB — COMPREHENSIVE METABOLIC PANEL
ALT: 18 U/L (ref 0–44)
AST: 21 U/L (ref 15–41)
Albumin: 4.1 g/dL (ref 3.5–5.0)
Alkaline Phosphatase: 39 U/L (ref 38–126)
Anion gap: 11 (ref 5–15)
BUN: 12 mg/dL (ref 6–20)
CO2: 24 mmol/L (ref 22–32)
Calcium: 9 mg/dL (ref 8.9–10.3)
Chloride: 99 mmol/L (ref 98–111)
Creatinine, Ser: 0.73 mg/dL (ref 0.44–1.00)
GFR, Estimated: >60 mL/min (ref >60)
Glucose, Bld: 115 mg/dL — ABNORMAL HIGH (ref 70–99)
Potassium: 4 mmol/L (ref 3.5–5.1)
Sodium: 134 mmol/L — ABNORMAL LOW (ref 135–145)
Total Bilirubin: 0.7 mg/dL (ref 0.3–1.2)
Total Protein: 7.3 g/dL (ref 6.5–8.1)

## 2020-08-06 LAB — CBC WITH DIFFERENTIAL/PLATELET
Abs Immature Granulocytes: 0.02 10*3/uL (ref 0.00–0.07)
Basophils Absolute: 0 10*3/uL (ref 0.0–0.1)
Basophils Relative: 0 %
Eosinophils Absolute: 0.1 10*3/uL (ref 0.0–0.5)
Eosinophils Relative: 1 %
HCT: 38.3 % (ref 36.0–46.0)
Hemoglobin: 12.2 g/dL (ref 12.0–15.0)
Immature Granulocytes: 0 %
Lymphocytes Relative: 16 %
Lymphs Abs: 1.2 10*3/uL (ref 0.7–4.0)
MCH: 30.2 pg (ref 26.0–34.0)
MCHC: 31.9 g/dL (ref 30.0–36.0)
MCV: 94.8 fL (ref 80.0–100.0)
Monocytes Absolute: 0.6 10*3/uL (ref 0.1–1.0)
Monocytes Relative: 8 %
Neutro Abs: 5.6 10*3/uL (ref 1.7–7.7)
Neutrophils Relative %: 75 %
Platelets: 284 10*3/uL (ref 150–400)
RBC: 4.04 MIL/uL (ref 3.87–5.11)
RDW: 13.1 % (ref 11.5–15.5)
WBC: 7.6 10*3/uL (ref 4.0–10.5)
nRBC: 0 % (ref 0.0–0.2)

## 2020-08-06 LAB — LIPASE, BLOOD: Lipase: 30 U/L (ref 11–51)

## 2020-08-06 MED ORDER — ONDANSETRON HCL 4 MG/2ML IJ SOLN
4.0000 mg | INTRAMUSCULAR | Status: AC
  Administered 2020-08-06: 4 mg via INTRAVENOUS
  Filled 2020-08-06: qty 2

## 2020-08-06 NOTE — ED Notes (Signed)
Patient taken to CT.

## 2020-08-06 NOTE — ED Triage Notes (Signed)
Pt states after dinner tonight she began to have nausea and diarrhea. Pt c/o lower abdominal pain. Pt denies vomiting.

## 2020-08-06 NOTE — ED Provider Notes (Signed)
[facility] Emergency Department Provider Note  ____________________________________________   Event Date/Time   First MD Initiated Contact with Patient 08/06/20 2313     (approximate)  I have reviewed the triage vital signs and the nursing notes.   HISTORY  Chief Complaint Abdominal Pain    HPI Shelley Flowers is a 60 y.o. female who reports acute onset of lower abdominal dull pain and cramping associated with nausea and multiple episodes of watery stool.  This is occurred over the last few hours, approximately 1 hour after she ate dinner.  Nothing particular makes it better or worse and she describes the symptoms as intermittently severe and currently mild.  She has some chronic issues with nausea and occasional abdominal pain that seems to be associated with her acid reflux but this is different.  She has been prescribed Linzess in the past so presumably she has been treated for if not specifically given a diagnosis of IBS , but she does not usually suffer from diarrhea.  She took a dose of Pepto-Bismol which she said frequently will settle her stomach but it did not help at this time.  She denies fever/chills, chest pain, shortness of breath, and vomiting although she is nauseated.  She has had no dysuria and she felt in her normal state of health until the symptoms started acutely a few hours ago.  She did not share a meal with anyone else as she prepares her own food at home.         Past Medical History:  Diagnosis Date  . GERD (gastroesophageal reflux disease)   . Hypertension     There are no problems to display for this patient.   Past Surgical History:  Procedure Laterality Date  . CESAREAN SECTION      Prior to Admission medications   Medication Sig Start Date End Date Taking? Authorizing Provider  ondansetron (ZOFRAN ODT) 4 MG disintegrating tablet Allow 1-2 tablets to dissolve in your mouth every 8 hours as needed for nausea/vomiting  08/07/20  Yes Loleta Rose, MD  cyclobenzaprine (FLEXERIL) 10 MG tablet Take 1 tablet (10 mg total) by mouth 3 (three) times daily as needed for muscle spasms. 04/20/19   Nita Sickle, MD  olmesartan-hydrochlorothiazide (BENICAR HCT) 40-25 MG tablet Take 1 tablet by mouth daily. 03/04/19   [provider]  omeprazole (PRILOSEC OTC) 20 MG tablet Take 20 mg by mouth daily.    [provider]    Allergies Patient has no known allergies.  History reviewed. No pertinent family history.  Social History Social History   Tobacco Use  . Smoking status: Never Smoker  . Smokeless tobacco: Never Used  Substance Use Topics  . Alcohol use: No  . Drug use: No    Review of Systems Constitutional: No fever/chills Eyes: No visual changes. ENT: No sore throat. Cardiovascular: Denies chest pain. Respiratory: Denies shortness of breath. Gastrointestinal: Lower aching abdominal pain with diarrhea and nausea, no vomiting. Genitourinary: Negative for dysuria. Musculoskeletal: Negative for neck pain.  Negative for back pain. Integumentary: Negative for rash. Neurological: Negative for headaches, focal weakness or numbness.   ____________________________________________   PHYSICAL EXAM:  VITAL SIGNS: ED Triage Vitals  Enc Vitals Group     BP 08/06/20 2254 (!) 165/81     Pulse Rate 08/06/20 2254 77     Resp 08/06/20 2254 18     Temp 08/06/20 2254 98.2 F (36.8 C)     Temp Source 08/06/20 2254 Oral  SpO2 08/06/20 2254 100 %     Weight 08/06/20 2253 63.5 kg (140 lb)     Height 08/06/20 2253 1.524 m (5')     Head Circumference --      Peak Flow --      Pain Score 08/06/20 2253 8     Pain Loc --      Pain Edu? --      Excl. in GC? --    Constitutional: Alert and oriented.  Eyes: Conjunctivae are normal.  Head: Atraumatic. Nose: No congestion/rhinnorhea. Mouth/Throat: Patient is wearing a mask. Neck: No stridor.  No meningeal signs.   Cardiovascular: Normal  rate, regular rhythm. Good peripheral circulation. Respiratory: Normal respiratory effort.  No retractions. Gastrointestinal: Soft and non-distended.  No significant tenderness to palpation.  No distention.  No guarding or rebound. Musculoskeletal: No lower extremity tenderness nor edema. No gross deformities of extremities. Neurologic:  Normal speech and language. No gross focal neurologic deficits are appreciated.  Skin:  Skin is warm, dry and intact. Psychiatric: Mood and affect are normal. Speech and behavior are normal.  ____________________________________________   LABS (all labs ordered are listed, but only abnormal results are displayed)  Labs Reviewed  COMPREHENSIVE METABOLIC PANEL - Abnormal; Notable for the following components:      Result Value   Sodium 134 (*)    Glucose, Bld 115 (*)    All other components within normal limits  GASTROINTESTINAL PANEL BY PCR, STOOL (REPLACES STOOL CULTURE)  C DIFFICILE QUICK SCREEN W PCR REFLEX  CBC WITH DIFFERENTIAL/PLATELET  LIPASE, BLOOD   ____________________________________________  EKG  No indication for emergent EKG ____________________________________________  RADIOLOGY Marylou Mccoy, personally viewed and evaluated these images (plain radiographs) as part of my medical decision making, as well as reviewing the written report by the radiologist.  ED MD interpretation:  No acute abnormalities identified on CT scan.  Official radiology report(s): CT ABDOMEN PELVIS WO CONTRAST  Result Date: 08/06/2020 CLINICAL DATA:  Nausea and diarrhea EXAM: CT ABDOMEN AND PELVIS WITHOUT CONTRAST TECHNIQUE: Multidetector CT imaging of the abdomen and pelvis was performed following the standard protocol without IV contrast. COMPARISON:  03/15/2017 FINDINGS: Lower chest: Lung bases demonstrate no acute consolidation or effusion. Coarse calcifications in the left breast. Normal cardiac size. Hepatobiliary: No focal liver abnormality is  seen. No gallstones, gallbladder wall thickening, or biliary dilatation. Pancreas: Unremarkable. No pancreatic ductal dilatation or surrounding inflammatory changes. Spleen: Normal in size without focal abnormality. Adrenals/Urinary Tract: Adrenal glands are unremarkable. Kidneys are normal, without renal calculi, focal lesion, or hydronephrosis. Bladder is unremarkable. Stomach/Bowel: Radiodense material in the stomach. No dilated small bowel. Diffuse diverticular disease of the colon without definitive wall thickening or inflammation. Negative appendix. Vascular/Lymphatic: No significant vascular findings are present. No enlarged abdominal or pelvic lymph nodes. Reproductive: Uterus and bilateral adnexa are unremarkable. Other: Negative for free air or free fluid. Musculoskeletal: No acute or significant osseous findings. IMPRESSION: 1. No CT evidence for acute intra-abdominal or pelvic abnormality. 2. Diverticular disease of the colon without acute inflammatory process Electronically Signed   By: Jasmine Pang M.D.   On: 08/06/2020 23:48    ____________________________________________   PROCEDURES   Procedure(s) performed (including Critical Care):  Procedures   ____________________________________________   INITIAL IMPRESSION / MDM / ASSESSMENT AND PLAN / ED COURSE  As part of my medical decision making, I reviewed the following data within the electronic MEDICAL RECORD NUMBER Nursing notes reviewed and incorporated, Labs reviewed , Old chart reviewed and  Notes from prior ED visits   Differential diagnosis includes, but is not limited to, presumed infectious diarrhea, foodborne pathogen, diverticulitis, nonspecific colitis or enteritis.  The patient is calm and in no apparent distress.  Vital signs are within normal limits.  Symptoms have been relatively acute in onset after eating.  Most likely this is the result of something that she ate.  Symptoms at this point seem relatively mild but  apparently were worse when she was at home.  She said this is unusual for her and thus concerning.  Given her concern, I will evaluate with labs and a CT scan of the abdomen and pelvis, anticipating colitis but attempting to rule out a process that would require antibiotics such as diverticulitis.  Patient understands and agrees with the plan.  Giving a dose of Zofran 4 mg IV for her nausea.     Clinical Course as of 08/07/20 0233  Mon Aug 07, 2020  0019 Lab work is all essentially normal.  CT scan is unremarkable with no evidence of acute abnormality to explain her symptoms.  Stool studies are pending. [CF]  0202 No abnormalities on GI pathogen panel and negative C. difficile.  Will reassess patient and anticipate discharge and outpatient conservative management. [CF]  0231 Patient says she is feeling better.  I updated her regarding the results and she is comfortable with the plan for discharge and outpatient follow-up.  Given that she frequently has issues with her stomach, I will give her follow-up information with GI.  I gave my usual and customary return precautions and she understands and agrees with the plan. [CF]    Clinical Course User Index [CF] Loleta Rose, MD     ____________________________________________  FINAL CLINICAL IMPRESSION(S) / ED DIAGNOSES  Final diagnoses:  Nausea  Diarrhea of presumed infectious origin  Lower abdominal pain     MEDICATIONS GIVEN DURING THIS VISIT:  Medications  ondansetron (ZOFRAN) injection 4 mg (4 mg Intravenous Given 08/06/20 2325)     ED Discharge Orders         Ordered    ondansetron (ZOFRAN ODT) 4 MG disintegrating tablet        08/07/20 0232          *Please note:  Shelley Flowers was evaluated in Emergency Department on 08/07/2020 for the symptoms described in the history of present illness. She was evaluated in the context of the global COVID-19 pandemic, which necessitated consideration that the patient might be at risk  for infection with the SARS-CoV-2 virus that causes COVID-19. Institutional protocols and algorithms that pertain to the evaluation of patients at risk for COVID-19 are in a state of rapid change based on information released by regulatory bodies including the CDC and federal and state organizations. These policies and algorithms were followed during the patient's care in the ED.  Some ED evaluations and interventions may be delayed as a result of limited staffing during and after the pandemic.*  Note:  This document was prepared using Dragon voice recognition software and may include unintentional dictation errors.   Loleta Rose, MD 08/07/20 [phone]

## 2020-08-07 LAB — GASTROINTESTINAL PANEL BY PCR, STOOL (REPLACES STOOL CULTURE)

## 2020-08-07 LAB — C DIFFICILE QUICK SCREEN W PCR REFLEX
C Diff antigen: NEGATIVE
C Diff interpretation: NOT DETECTED
C Diff toxin: NEGATIVE

## 2020-08-07 MED ORDER — ONDANSETRON 4 MG PO TBDP: ORAL_TABLET | ORAL | 0 refills | Status: DC

## 2020-08-07 NOTE — Discharge Instructions (Signed)

## 2020-09-18 DIAGNOSIS — E039 Hypothyroidism, unspecified: Secondary | ICD-10-CM | POA: Diagnosis not present

## 2020-09-18 DIAGNOSIS — R7303 Prediabetes: Secondary | ICD-10-CM | POA: Diagnosis not present

## 2020-09-18 DIAGNOSIS — I10 Essential (primary) hypertension: Secondary | ICD-10-CM | POA: Diagnosis not present

## 2020-09-18 DIAGNOSIS — E7800 Pure hypercholesterolemia, unspecified: Secondary | ICD-10-CM | POA: Diagnosis not present

## 2020-09-18 DIAGNOSIS — E559 Vitamin D deficiency, unspecified: Secondary | ICD-10-CM | POA: Diagnosis not present

## 2020-09-18 DIAGNOSIS — R110 Nausea: Secondary | ICD-10-CM | POA: Diagnosis not present

## 2020-09-18 DIAGNOSIS — R5383 Other fatigue: Secondary | ICD-10-CM | POA: Diagnosis not present

## 2020-09-18 DIAGNOSIS — K219 Gastro-esophageal reflux disease without esophagitis: Secondary | ICD-10-CM | POA: Diagnosis not present

## 2020-12-17 ENCOUNTER — Encounter: Payer: Self-pay | Admitting: Emergency Medicine

## 2020-12-17 ENCOUNTER — Emergency Department
Admission: EM | Admit: 2020-12-17 | Discharge: 2020-12-18 | Disposition: A | Discharge status: Home / Self Care | Payer: BC Managed Care – PPO | Attending: Emergency Medicine | Admitting: Emergency Medicine

## 2020-12-17 ENCOUNTER — Other Ambulatory Visit: Payer: Self-pay

## 2020-12-17 DIAGNOSIS — R110 Nausea: Secondary | ICD-10-CM | POA: Insufficient documentation

## 2020-12-17 DIAGNOSIS — I10 Essential (primary) hypertension: Secondary | ICD-10-CM | POA: Insufficient documentation

## 2020-12-17 DIAGNOSIS — Z79899 Other long term (current) drug therapy: Secondary | ICD-10-CM | POA: Insufficient documentation

## 2020-12-17 HISTORY — DX: Irritable bowel syndrome, unspecified: K58.9

## 2020-12-17 LAB — CBG MONITORING, ED: Glucose-Capillary: 104 mg/dL — ABNORMAL HIGH (ref 70–99)

## 2020-12-17 LAB — BASIC METABOLIC PANEL
Anion gap: 7 (ref 5–15)
BUN: 14 mg/dL (ref 6–20)
CO2: 23 mmol/L (ref 22–32)
Calcium: 8.8 mg/dL — ABNORMAL LOW (ref 8.9–10.3)
Chloride: 104 mmol/L (ref 98–111)
Creatinine, Ser: 0.71 mg/dL (ref 0.44–1.00)
GFR, Estimated: >60 mL/min (ref >60)
Glucose, Bld: 112 mg/dL — ABNORMAL HIGH (ref 70–99)
Potassium: 3.7 mmol/L (ref 3.5–5.1)
Sodium: 134 mmol/L — ABNORMAL LOW (ref 135–145)

## 2020-12-17 LAB — CBC
HCT: 37.4 % (ref 36.0–46.0)
Hemoglobin: 12.4 g/dL (ref 12.0–15.0)
MCH: 31.5 pg (ref 26.0–34.0)
MCHC: 33.2 g/dL (ref 30.0–36.0)
MCV: 94.9 fL (ref 80.0–100.0)
Platelets: 300 10*3/uL (ref 150–400)
RBC: 3.94 MIL/uL (ref 3.87–5.11)
RDW: 13.2 % (ref 11.5–15.5)
WBC: 5.1 10*3/uL (ref 4.0–10.5)
nRBC: 0 % (ref 0.0–0.2)

## 2020-12-17 LAB — TROPONIN I (HIGH SENSITIVITY): Troponin I (High Sensitivity): 2 ng/L (ref <18)

## 2020-12-17 MED ORDER — ONDANSETRON 4 MG PO TBDP: ORAL_TABLET | ORAL | 0 refills | Status: DC

## 2020-12-17 MED ORDER — ACETAMINOPHEN 325 MG PO TABS
650.0000 mg | ORAL_TABLET | Freq: Once | ORAL | Status: AC
  Administered 2020-12-17: 650 mg via ORAL
  Filled 2020-12-17: qty 2

## 2020-12-17 NOTE — ED Triage Notes (Signed)
Pt presents ambulatory to triage with complaints of hypertension, HA, dizziness & nausea while lying in chair eating grapes this evening. Pt states she had not yet taken her HTN medications, took her BP which showed 190/112. Pt immediately took meds, presented to ED for persistent sx.

## 2020-12-17 NOTE — ED Notes (Signed)
ED Provider at bedside. 

## 2020-12-17 NOTE — ED Provider Notes (Signed)
[facility] Emergency Department Provider Note  ____________________________________________   Event Date/Time   First MD Initiated Contact with Patient 12/17/20 2324     (approximate)  I have reviewed the triage vital signs and the nursing notes.   HISTORY  Chief Complaint No chief complaint on file.    HPI Shelley Flowers is a 60 y.o. female with medical history as listed below who presents for evaluation of acute onset but mild dizziness, headache, and some nausea.  She was lying down and eating some grapes when she felt the symptoms started.  She checked her blood pressure and it was elevated.  She checked it several times at home and it was as high as 190/112.  She admits that she has not been taking her blood pressure medicine recently because her blood pressure has been good so she thought she did not need it anymore.  She has not had any recent injuries.  She reports that she was fired unexpectedly from her job about 2 days ago which has been on her mind.  After finding out that she had a high blood pressure, it scared her and she took a dose of her blood pressure medicine.  She checked her pressure again and it was still high so she called her daughter and they came to the emergency department.  She reports that her symptoms are "easing off" and she feels better now.  She has no numbness nor weakness in her extremities.  She has not vomited.  She denies visual changes, neck pain, chest pain, shortness of breath, and abdominal pain.  She is ambulatory without difficulty.  Nothing in particular made her symptoms worse and taking her blood pressure medicine seems to have made them better.  She also reports that she has been having some nasal congestion and "sinus pain" and she used some nasal spray earlier tonight although it is unclear what kind of nasal spray it was.     Past Medical History:  Diagnosis Date   GERD (gastroesophageal reflux disease)     Hypertension    IBS (irritable bowel syndrome)     There are no problems to display for this patient.   Past Surgical History:  Procedure Laterality Date   CESAREAN SECTION      Prior to Admission medications   Medication Sig Start Date End Date Taking? Authorizing Provider  cyclobenzaprine (FLEXERIL) 10 MG tablet Take 1 tablet (10 mg total) by mouth 3 (three) times daily as needed for muscle spasms. 04/20/19   Nita Sickle, MD  olmesartan-hydrochlorothiazide (BENICAR HCT) 40-25 MG tablet Take 1 tablet by mouth daily. 03/04/19   [provider]  omeprazole (PRILOSEC OTC) 20 MG tablet Take 20 mg by mouth daily.    [provider]  ondansetron (ZOFRAN ODT) 4 MG disintegrating tablet Allow 1-2 tablets to dissolve in your mouth every 8 hours as needed for nausea/vomiting 12/17/20   Loleta Rose, MD    Allergies Patient has no known allergies.  History reviewed. No pertinent family history.  Social History Social History   Tobacco Use   Smoking status: Never   Smokeless tobacco: Never  Substance Use Topics   Alcohol use: No   Drug use: No    Review of Systems Constitutional: No fever/chills Eyes: No visual changes. ENT: No sore throat.  Positive for "sinus pain". Cardiovascular: Denies chest pain. Respiratory: Denies shortness of breath. Gastrointestinal: No abdominal pain.  Nausea, no vomiting.  No diarrhea.  No constipation. Genitourinary:  Negative for dysuria. Musculoskeletal: Negative for neck pain.  Negative for back pain. Integumentary: Negative for rash. Neurological: Mild dizziness and generalized dull headache.  No focal numbness nor weakness in her extremities.   ____________________________________________   PHYSICAL EXAM:  VITAL SIGNS: ED Triage Vitals  Enc Vitals Group     BP 12/17/20 2205 (!) 161/89     Pulse Rate 12/17/20 2205 68     Resp 12/17/20 2205 18     Temp 12/17/20 2205 98.1 F (36.7 C)     Temp Source 12/17/20 2205  Oral     SpO2 12/17/20 2205 97 %     Weight 12/17/20 2206 63.5 kg (140 lb)     Height 12/17/20 2206 1.524 m (5')     Head Circumference --      Peak Flow --      Pain Score --      Pain Loc --      Pain Edu? --      Excl. in GC? --     Constitutional: Alert and oriented.  Eyes: Conjunctivae are normal.  Pupils are equal and reactive.  Extraocular motion is intact and normal.  No nystagmus. Head: Atraumatic. Nose: No congestion/rhinnorhea. Mouth/Throat: Patient is wearing a mask. Neck: No stridor.  No meningeal signs.   Cardiovascular: Normal rate, regular rhythm. Good peripheral circulation. Respiratory: Normal respiratory effort.  No retractions. Gastrointestinal: Soft and nontender. No distention.  Musculoskeletal: No lower extremity tenderness nor edema. No gross deformities of extremities. Neurologic:  Normal speech and language. No gross focal neurologic deficits are appreciated.  She has good and normal strength in bilateral upper and lower extremities.  No dysmetria, no nystagmus. Skin:  Skin is warm, dry and intact. Psychiatric: Mood and affect are normal. Speech and behavior are normal.  ____________________________________________   LABS (all labs ordered are listed, but only abnormal results are displayed)  Labs Reviewed  BASIC METABOLIC PANEL - Abnormal; Notable for the following components:      Result Value   Sodium 134 (*)    Glucose, Bld 112 (*)    Calcium 8.8 (*)    All other components within normal limits  CBG MONITORING, ED - Abnormal; Notable for the following components:   Glucose-Capillary 104 (*)    All other components within normal limits  CBC  TROPONIN I (HIGH SENSITIVITY)   ____________________________________________  EKG  ED ECG REPORT I, Loleta Rose, the attending physician, personally viewed and interpreted this ECG.  Date: 12/17/2020 EKG Time: 22: 24 Rate: 67 Rhythm: normal sinus rhythm QRS Axis: normal Intervals: normal ST/T  Wave abnormalities: Some nonspecific changes but nothing indicative of ischemia Narrative Interpretation: no evidence of acute ischemia  ____________________________________________   INITIAL IMPRESSION / MDM / ASSESSMENT AND PLAN / ED COURSE  As part of my medical decision making, I reviewed the following data within the electronic MEDICAL RECORD NUMBER Nursing notes reviewed and incorporated, Labs reviewed , EKG interpreted , Old chart reviewed, and Notes from prior ED visits   Differential diagnosis includes, but is not limited to, essential hypertension, viral illness, benign positional vertigo, CVA.  Much less likely ACS.  Vital signs are stable with blood pressure improving and down to 153/93.  She is calm and in no distress.  Very reassuring physical exam, normal neurological exam, nonischemic EKG.  Basic metabolic panel, CBC, high-sensitivity troponin all within normal limits.  Patient is essentially asymptomatic at this time, said that she is improving and she seems reassured after our  discussion.  She is comfortable with the plan for discharge and outpatient follow-up.  I gave my usual and customary return precautions.          ____________________________________________  FINAL CLINICAL IMPRESSION(S) / ED DIAGNOSES  Final diagnoses:  Essential hypertension     MEDICATIONS GIVEN DURING THIS VISIT:  Medications  acetaminophen (TYLENOL) tablet 650 mg (has no administration in time range)     ED Discharge Orders          Ordered    ondansetron (ZOFRAN ODT) 4 MG disintegrating tablet        12/17/20 2339             Note:  This document was prepared using Dragon voice recognition software and may include unintentional dictation errors.   Loleta Rose, MD 12/18/20 [phone]

## 2020-12-17 NOTE — Discharge Instructions (Signed)
As we discussed, though you do have high blood pressure (hypertension), fortunately it is not immediately dangerous at this time and does not need emergency intervention or admission to the hospital.  (It has come down a lot since you were home.) If we add to or change your regular medications, we may cause more harm than good - it is more appropriate for your primary care doctor to evaluate you in clinic and decide if any medication changes are needed.  Please follow up in clinic as recommended in these papers.    Given that you are having some nasal or sinus issues as well, your symptoms could be the result of mild viral infection.  Please drink plenty of fluids to stay hydrated and take ibuprofen and/or Tylenol as needed for headache, nasal/sinus pain, etc.  Be sure to continue taking your blood pressure medicine.  We also gave you an additional prescription for nausea medicine if that becomes necessary.  Return to the Emergency Department (ED) if you experience any worsening chest pain/pressure/tightness, difficulty breathing, or sudden sweating, or other symptoms that concern you.

## 2021-04-05 ENCOUNTER — Emergency Department
Admission: EM | Admit: 2021-04-05 | Discharge: 2021-04-05 | Disposition: A | Discharge status: Home / Self Care | Payer: Self-pay | Attending: Emergency Medicine | Admitting: Emergency Medicine

## 2021-04-05 ENCOUNTER — Other Ambulatory Visit: Payer: Self-pay

## 2021-04-05 DIAGNOSIS — R7303 Prediabetes: Secondary | ICD-10-CM | POA: Insufficient documentation

## 2021-04-05 DIAGNOSIS — I10 Essential (primary) hypertension: Secondary | ICD-10-CM | POA: Insufficient documentation

## 2021-04-05 DIAGNOSIS — R202 Paresthesia of skin: Secondary | ICD-10-CM | POA: Insufficient documentation

## 2021-04-05 NOTE — ED Provider Notes (Signed)
[facility] Provider Note    Event Date/Time   First MD Initiated Contact with Patient 04/05/21 1002     (approximate)   History   Hypertension   HPI  Shelley Flowers is a 61 y.o. female presents emergency department complaining of elevated blood pressure last night.  Patient had stopped taking her regular blood pressure medication.  Took Sudafed on Tuesday.  Noticed that her blood pressure is very high last night on her wrist blood pressure monitor.  Had tingling in the right arm and right leg.  No weakness.  No slurred speech.  No change in vision.  Symptoms are resolved this morning and blood pressure is decreased since she took her medication.  Patient has not seen her physician in 6 months.  Patient has history of prediabetes, high cholesterol, and hypertension      Physical Exam   Triage Vital Signs: ED Triage Vitals [04/05/21 0957]  Enc Vitals Group     BP (!) 161/96     Pulse Rate 73     Resp 20     Temp 98.4 F (36.9 C)     Temp Source Oral     SpO2 98 %     Weight 140 lb (63.5 kg)     Height 5' (1.524 m)     Head Circumference      Peak Flow      Pain Score 4     Pain Loc      Pain Edu?      Excl. in [place]?     Most recent vital signs: Vitals:   04/05/21 0957 04/05/21 1020  BP: (!) 161/96 (!) 154/81  Pulse: 73 69  Resp: 20 16  Temp: 98.4 F (36.9 C)   SpO2: 98% 95%     General: Awake, no distress.   CV:  Good peripheral perfusion. regular rate and  rhythm Resp:  Normal effort. Lungs CTA Abd:  No distention.   Other:  Cranial nerves II through XII grossly intact, strength is equal bilaterally   ED Results / Procedures / Treatments   Labs (all labs ordered are listed, but only abnormal results are displayed) Labs Reviewed - No data to display   EKG     RADIOLOGY     PROCEDURES:   Procedures   MEDICATIONS ORDERED IN ED: Medications - No data to display   IMPRESSION / MDM / [place] / ED  COURSE  I reviewed the triage vital signs and the nursing notes.                              Differential diagnosis includes, but is not limited to, hypertension, CVA, noncompliance with medications  Patient's blood pressure has returned to a normal level, reading today is 154/81.  I explained to the patient that Sudafed can elevate her blood pressure she should not take this medication.  If she has sinus problems she could take Coricidin.  She is encouraged to stay on her regular medications as she has not been taking her cholesterol medicine either.  She has been following advice from the vitamin shop.  Explained to her that people in the vitamin shop or not pharmacist or physicians and that I think she should follow-up with her regular doctor to reevaluate all of her medications.  Explained to her she should stay on her regular prescription medications and hold the over-the-counter ones until she  has discussed this with her doctor.  Patient is in agreement with treatment plan.  She is discharged stable condition.         FINAL CLINICAL IMPRESSION(S) / ED DIAGNOSES   Final diagnoses:  Hypertension, unspecified type     Rx / DC Orders   ED Discharge Orders     None        Note:  This document was prepared using Dragon voice recognition software and may include unintentional dictation errors.    Versie Starks, PA-C 04/05/21 1043    Harvest Dark, MD 04/05/21 1319

## 2021-04-05 NOTE — ED Triage Notes (Signed)
Pt to ED for HTN since 2000 last night. Reports taking meds as prescribed. Reports slight headache.

## 2021-04-05 NOTE — ED Notes (Signed)
Pt advised she has hx of htn, gerd, and migraines. The pt advised at approx. 8pm last night she assessed her bp which was 213/107. The pt advised she has been without her bp med due to feeling like she didn't need it. The pt took her bp med last night. The pt advised she had reassessed her bp this morning again which was she advised read the same. The pt then decided top drive herself to the ed. The pt is currently c/o a headache and some slight chest discomfort.

## 2021-04-05 NOTE — Discharge Instructions (Signed)
Follow-up with your regular doctor.  Please take all of your prescribed medications regularly.

## 2021-08-06 ENCOUNTER — Other Ambulatory Visit: Payer: Self-pay

## 2021-08-06 ENCOUNTER — Emergency Department: Payer: Self-pay

## 2021-08-06 ENCOUNTER — Emergency Department
Admission: EM | Admit: 2021-08-06 | Discharge: 2021-08-06 | Disposition: A | Discharge status: Home / Self Care | Payer: Self-pay | Attending: Emergency Medicine | Admitting: Emergency Medicine

## 2021-08-06 DIAGNOSIS — R079 Chest pain, unspecified: Secondary | ICD-10-CM

## 2021-08-06 DIAGNOSIS — R0789 Other chest pain: Secondary | ICD-10-CM | POA: Insufficient documentation

## 2021-08-06 LAB — CBC
HCT: 38 % (ref 36.0–46.0)
Hemoglobin: 11.8 g/dL — ABNORMAL LOW (ref 12.0–15.0)
MCH: 29.8 pg (ref 26.0–34.0)
MCHC: 31.1 g/dL (ref 30.0–36.0)
MCV: 96 fL (ref 80.0–100.0)
Platelets: 289 10*3/uL (ref 150–400)
RBC: 3.96 MIL/uL (ref 3.87–5.11)
RDW: 13.5 % (ref 11.5–15.5)
WBC: 4.8 10*3/uL (ref 4.0–10.5)
nRBC: 0 % (ref 0.0–0.2)

## 2021-08-06 LAB — BASIC METABOLIC PANEL
Anion gap: 8 (ref 5–15)
BUN: 11 mg/dL (ref 6–20)
CO2: 26 mmol/L (ref 22–32)
Calcium: 9 mg/dL (ref 8.9–10.3)
Chloride: 104 mmol/L (ref 98–111)
Creatinine, Ser: 0.59 mg/dL (ref 0.44–1.00)
GFR, Estimated: >60 mL/min (ref >60)
Glucose, Bld: 104 mg/dL — ABNORMAL HIGH (ref 70–99)
Potassium: 3.8 mmol/L (ref 3.5–5.1)
Sodium: 138 mmol/L (ref 135–145)

## 2021-08-06 LAB — TROPONIN I (HIGH SENSITIVITY): Troponin I (High Sensitivity): 3 ng/L (ref <18)

## 2021-08-06 NOTE — Discharge Instructions (Addendum)
Please start taking your Pepcid as 40 mg at least 1 hour prior to going to sleep.  Please follow-up with the above listed cardiologist if symptoms persist

## 2021-08-06 NOTE — ED Notes (Signed)
39 yof with a c/c of chest pain for a month. The pt advised she had lifted some heavy boxes a month ago and thought she may have hurt her chest. The pt advised she took tylenol and the pain eventually went away. The pt advised this morning she woke up and was having chest pain she described as throbbing.

## 2021-08-06 NOTE — ED Notes (Signed)
dc ppw provided to patient, questions answered, followup and RX information provided. Pt declines VS at time of dc and provides verbal consent for dc. PT ambulatory to lobby alert and oriented at this time. 

## 2021-08-06 NOTE — ED Triage Notes (Signed)
Pt to ED for centralized chest pain for one month. States worsening today and worsening with exertion. Denies shob, n.v NAD noted

## 2021-08-06 NOTE — ED Provider Notes (Signed)
[facility] Provider Note   Event Date/Time   First MD Initiated Contact with Patient 08/06/21 1440     (approximate) History  Chest Pain  HPI Shelley Flowers is a 61 y.o. female Location: Central chest Duration: 1 month prior to arrival Timing: Intermittent and worse in the morning Severity: 6/10 Quality: Burning/aching Context: Patient states she first felt this pain after lifting a heavy set of boxes out of a truck approximately 1 month ago.  Since that time patient has noticed worsening pain in the morning that occasionally improves throughout the day Modifying factors: Denies any worsening on exertion.  States that Pepcid somewhat improved this pain as does Motrin Associated Symptoms: Denies ROS: Patient currently denies any vision changes, tinnitus, difficulty speaking, facial droop, sore throat, chest pain, shortness of breath, abdominal pain, nausea/vomiting/diarrhea, dysuria, or weakness/numbness/paresthesias in any extremity   Physical Exam  Triage Vital Signs: ED Triage Vitals  Enc Vitals Group     BP 08/06/21 1239 (!) 158/92     Pulse Rate 08/06/21 1239 64     Resp 08/06/21 1239 18     Temp 08/06/21 1239 97.8 F (36.6 C)     Temp src --      SpO2 08/06/21 1239 99 %     Weight 08/06/21 1240 140 lb (63.5 kg)     Height 08/06/21 1240 5' (1.524 m)     Head Circumference --      Peak Flow --      Pain Score 08/06/21 1239 6     Pain Loc --      Pain Edu? --      Excl. in [place]? --    Most recent vital signs: Vitals:   08/06/21 1239  BP: (!) 158/92  Pulse: 64  Resp: 18  Temp: 97.8 F (36.6 C)  SpO2: 99%   General: Awake, oriented x4. CV:  Good peripheral perfusion.  Resp:  Normal effort.  Abd:  No distention.  Other:  Middle-aged African-American overweight female laying in bed in no distress ED Results / Procedures / Treatments  Labs (all labs ordered are listed, but only abnormal results are displayed) Labs Reviewed  BASIC  METABOLIC PANEL - Abnormal; Notable for the following components:      Result Value   Glucose, Bld 104 (*)    All other components within normal limits  CBC - Abnormal; Notable for the following components:   Hemoglobin 11.8 (*)    All other components within normal limits  TROPONIN I (HIGH SENSITIVITY)  TROPONIN I (HIGH SENSITIVITY)   EKG ED ECG REPORT I, Naaman Plummer, the attending physician, personally viewed and interpreted this ECG. Date: 08/06/2021 EKG Time: 1237 Rate: 65 Rhythm: normal sinus rhythm QRS Axis: normal Intervals: normal ST/T Wave abnormalities: normal Narrative Interpretation: no evidence of acute ischemia RADIOLOGY ED MD interpretation: 2 view chest x-ray interpreted by me shows no evidence of acute abnormalities including no pneumonia, pneumothorax, or widened mediastinum -Agree with radiology assessment Official radiology report(s): DG Chest 2 View  Result Date: 08/06/2021 CLINICAL DATA:  Chest pain this morning after lifting a heavy box. EXAM: CHEST - 2 VIEW COMPARISON:  04/19/2019 FINDINGS: Normal sized heart. Clear lungs with normal vascularity. Thoracic spine degenerative changes. No fracture or pneumothorax. IMPRESSION: No acute abnormality. Electronically Signed   By: Claudie Revering M.D.   On: 08/06/2021 13:42   PROCEDURES: Critical Care performed: No .1-3 Lead EKG Interpretation Performed by: Naaman Plummer, MD Authorized  by: Naaman Plummer, MD     Interpretation: normal     ECG rate:  65   ECG rate assessment: normal     Rhythm: sinus rhythm     Ectopy: none     Conduction: normal   MEDICATIONS ORDERED IN ED: Medications - No data to display IMPRESSION / MDM / [place] / ED COURSE  I reviewed the triage vital signs and the nursing notes.                             The patient is on the cardiac monitor to evaluate for evidence of arrhythmia and/or significant heart rate changes. Patient is 61 year old female with above-stated  past medical history presents for 1 month of intermittent chest pain that is worse in the morning Workup: ECG, CXR, CBC, BMP, Troponin Findings: ECG: No overt evidence of STEMI. No evidence of Brugadas sign, delta wave, epsilon wave, significantly prolonged QTc, or malignant arrhythmia HS Troponin: Negative x1 Other Labs unremarkable for emergent problems. CXR: Without PTX, PNA, or widened mediastinum Last Stress Test: Never Last Heart Catheterization: Never HEART Score: 3  Given History, Exam, and Workup I have low suspicion for ACS, Pneumothorax, Pneumonia, Pulmonary Embolus, Tamponade, Aortic Dissection or other emergent problem as a cause for this presentation.   Reassesment: Prior to discharge patients pain was controlled and they were well appearing.  Disposition:  Discharge. Strict return precautions discussed with patient with full understanding. Advised patient to follow up promptly with primary care provider      FINAL CLINICAL IMPRESSION(S) / ED DIAGNOSES   Final diagnoses:  Nonspecific chest pain   Rx / DC Orders   ED Discharge Orders     None      Note:  This document was prepared using Dragon voice recognition software and may include unintentional dictation errors.   Naaman Plummer, MD 08/06/21 1534

## 2022-05-09 ENCOUNTER — Emergency Department: Payer: Self-pay

## 2022-05-09 ENCOUNTER — Emergency Department
Admission: EM | Admit: 2022-05-09 | Discharge: 2022-05-09 | Disposition: A | Discharge status: Home / Self Care | Payer: Self-pay | Attending: Emergency Medicine | Admitting: Emergency Medicine

## 2022-05-09 DIAGNOSIS — R0789 Other chest pain: Secondary | ICD-10-CM | POA: Insufficient documentation

## 2022-05-09 DIAGNOSIS — R140 Abdominal distension (gaseous): Secondary | ICD-10-CM | POA: Diagnosis not present

## 2022-05-09 DIAGNOSIS — I10 Essential (primary) hypertension: Secondary | ICD-10-CM | POA: Insufficient documentation

## 2022-05-09 DIAGNOSIS — K5900 Constipation, unspecified: Secondary | ICD-10-CM | POA: Insufficient documentation

## 2022-05-09 LAB — TROPONIN I (HIGH SENSITIVITY): Troponin I (High Sensitivity): 3 ng/L (ref <18)

## 2022-05-09 LAB — CBC
HCT: 38.3 % (ref 36.0–46.0)
Hemoglobin: 12.2 g/dL (ref 12.0–15.0)
MCH: 29.4 pg (ref 26.0–34.0)
MCHC: 31.9 g/dL (ref 30.0–36.0)
MCV: 92.3 fL (ref 80.0–100.0)
Platelets: 299 10*3/uL (ref 150–400)
RBC: 4.15 MIL/uL (ref 3.87–5.11)
RDW: 13.4 % (ref 11.5–15.5)
WBC: 8.4 10*3/uL (ref 4.0–10.5)
nRBC: 0 % (ref 0.0–0.2)

## 2022-05-09 LAB — BASIC METABOLIC PANEL
Anion gap: 13 (ref 5–15)
BUN: 15 mg/dL (ref 8–23)
CO2: 25 mmol/L (ref 22–32)
Calcium: 9.2 mg/dL (ref 8.9–10.3)
Chloride: 99 mmol/L (ref 98–111)
Creatinine, Ser: 0.8 mg/dL (ref 0.44–1.00)
GFR, Estimated: >60 mL/min (ref >60)
Glucose, Bld: 111 mg/dL — ABNORMAL HIGH (ref 70–99)
Potassium: 3.6 mmol/L (ref 3.5–5.1)
Sodium: 137 mmol/L (ref 135–145)

## 2022-05-09 NOTE — Discharge Instructions (Signed)
Your exam, labs, chest x-ray, EKG were all reassuring this time.  No evidence of heart related abdominal and chest pain.  Symptoms likely related to reflux, bloating, and your history of IBS.  Take OTC MiraLAX up to 3 times daily until you have a reasonable stool.  May also take OTC simethicone to help relieve gas and bloating.  You should follow-up with your selected primary care provider, and schedule your routine screening mammogram and colonoscopies as soon as possible.  Measure your blood pressure daily, take your medication as prescribed.  Good luck and good to meet you.

## 2022-05-09 NOTE — ED Provider Notes (Signed)
[facility] Emergency Department Provider Note     Event Date/Time   First MD Initiated Contact with Patient 05/09/22 1703     (approximate)   History   Chest Pain   HPI  Shelley Flowers is a 62 y.o. female with history of hypertension, GERD, and IBS, presents to the ED for evaluation of what she describes as "gas" in her chest, described as tightness. She notes symptoms of constipation and bloating. She typically take Linzess, but has been out of it for a time.  She notes fim, hard stools passed earlier.  Patient says she has been gassy for the last couple days and thought it would resolve but it has not gone away.  Patient denies any nausea, vomiting, diarrhea, or rectal bleeding. Patient also denies any shortness of breath, cough, congestion.   Physical Exam   Triage Vital Signs: ED Triage Vitals [05/09/22 1600]  Enc Vitals Group     BP (!) 143/79     Pulse Rate 100     Resp 18     Temp 98.4 F (36.9 C)     Temp Source Oral     SpO2 98 %     Weight 140 lb (63.5 kg)     Height      Head Circumference      Peak Flow      Pain Score 10     Pain Loc      Pain Edu?      Excl. in [place]?     Most recent vital signs: Vitals:   05/09/22 1600  BP: (!) 143/79  Pulse: 100  Resp: 18  Temp: 98.4 F (36.9 C)  SpO2: 98%    General Awake, no distress.  NAD CV:  Good peripheral perfusion.  RESP:  Normal effort.  ABD:  No distention.  Soft and nontender.  Normoactive bowel sounds appreciated.   ED Results / Procedures / Treatments   Labs (all labs ordered are listed, but only abnormal results are displayed) Labs Reviewed  BASIC METABOLIC PANEL - Abnormal; Notable for the following components:      Result Value   Glucose, Bld 111 (*)    All other components within normal limits  CBC  TROPONIN I (HIGH SENSITIVITY)  TROPONIN I (HIGH SENSITIVITY)     EKG  Vent. rate 98 BPM PR interval 150 ms QRS duration 76 ms QT/QTcB 354/451  ms P-R-T axes 55 53 49 Normal sinus rhythm Cannot rule out Anterior infarct , age undetermined Abnormal ECG When compared with ECG of 06-Aug-2021 12:37,  RADIOLOGY  I personally viewed and evaluated these images as part of my medical decision making, as well as reviewing the written report by the radiologist.  ED Provider Interpretation: No acute findings  DG Chest 2 View  Result Date: 05/09/2022 CLINICAL DATA:  Chest pressure. EXAM: CHEST - 2 VIEW COMPARISON:  Aug 06, 2021. FINDINGS: The heart size and mediastinal contours are within normal limits. Both lungs are clear. The visualized skeletal structures are unremarkable. IMPRESSION: No active cardiopulmonary disease. Electronically Signed   By: Marijo Conception M.D.   On: 05/09/2022 16:35     PROCEDURES:  Critical Care performed: No  Procedures   MEDICATIONS ORDERED IN ED: Medications - No data to display   IMPRESSION / MDM / [place] / ED COURSE  I reviewed the triage vital signs and the nursing notes.  Differential diagnosis includes, but is not limited to,  ACS, aortic dissection, pulmonary embolism, cardiac tamponade, pneumothorax, pneumonia, pericarditis, myocarditis, GI-related causes including esophagitis/gastritis, and musculoskeletal chest wall pain.     Patient's presentation is most consistent with acute presentation with potential threat to life or bodily function.  Patient's diagnosis is consistent with GI etiology for her abdominal pain.  Patient with sepsis with gas and bloating.  Patient with reassuring workup for chest pain including normal labs, negative troponin, and reassuring x-ray.  No EKG evidence of any acute arrhythmia or STEMI.  Patient will be discharged home with instructions to take OTC MiraLAX up to 3 times a day until she has meaningful stool.  Significant amount of time was also spent discussing the patient's need to establish care with a PCP, as well as  routine screening colonoscopy and mammograms.  Patient is also encouraged to track and monitor her blood pressure ahead of her initial meeting with newly selected PCP.  Patient is to follow up with her PCP as well as GI medicine as appropriate, as needed or otherwise directed. Patient is given ED precautions to return to the ED for any worsening or new symptoms.  FINAL CLINICAL IMPRESSION(S) / ED DIAGNOSES   Final diagnoses:  Abdominal bloating     Rx / DC Orders   ED Discharge Orders     None        Note:  This document was prepared using Dragon voice recognition software and may include unintentional dictation errors.    Melvenia Needles, PA-C 05/09/22 2111    Vanessa Yeadon, MD 05/13/22 [phone]

## 2022-05-09 NOTE — ED Notes (Signed)
Pt with c/o chest pressure since last evening, no SOB noted, pt alert and walking in room

## 2022-05-09 NOTE — ED Triage Notes (Signed)
Pt sts that last night she started to have a gas feeling in her chest. Pt sts that she has been gassy the last couple of days and thought that was it but it has not gone away.

## 2022-05-11 DIAGNOSIS — R140 Abdominal distension (gaseous): Secondary | ICD-10-CM | POA: Diagnosis not present

## 2022-05-11 DIAGNOSIS — Z8719 Personal history of other diseases of the digestive system: Secondary | ICD-10-CM | POA: Diagnosis not present

## 2022-05-11 DIAGNOSIS — Z760 Encounter for issue of repeat prescription: Secondary | ICD-10-CM | POA: Diagnosis not present

## 2022-06-04 ENCOUNTER — Other Ambulatory Visit: Payer: Self-pay

## 2022-06-04 ENCOUNTER — Emergency Department
Admission: EM | Admit: 2022-06-04 | Discharge: 2022-06-04 | Disposition: A | Discharge status: Home / Self Care | Payer: BC Managed Care – PPO | Attending: Emergency Medicine | Admitting: Emergency Medicine

## 2022-06-04 DIAGNOSIS — I10 Essential (primary) hypertension: Secondary | ICD-10-CM | POA: Insufficient documentation

## 2022-06-04 DIAGNOSIS — K644 Residual hemorrhoidal skin tags: Secondary | ICD-10-CM | POA: Diagnosis not present

## 2022-06-04 DIAGNOSIS — R109 Unspecified abdominal pain: Secondary | ICD-10-CM | POA: Diagnosis not present

## 2022-06-04 DIAGNOSIS — K625 Hemorrhage of anus and rectum: Secondary | ICD-10-CM

## 2022-06-04 LAB — CBC
HCT: 37.2 % (ref 36.0–46.0)
Hemoglobin: 11.6 g/dL — ABNORMAL LOW (ref 12.0–15.0)
MCH: 29.1 pg (ref 26.0–34.0)
MCHC: 31.2 g/dL (ref 30.0–36.0)
MCV: 93.2 fL (ref 80.0–100.0)
Platelets: 318 10*3/uL (ref 150–400)
RBC: 3.99 MIL/uL (ref 3.87–5.11)
RDW: 13.4 % (ref 11.5–15.5)
WBC: 4.8 10*3/uL (ref 4.0–10.5)
nRBC: 0 % (ref 0.0–0.2)

## 2022-06-04 LAB — COMPREHENSIVE METABOLIC PANEL
ALT: 25 U/L (ref 0–44)
AST: 27 U/L (ref 15–41)
Albumin: 3.9 g/dL (ref 3.5–5.0)
Alkaline Phosphatase: 47 U/L (ref 38–126)
Anion gap: 13 (ref 5–15)
BUN: 11 mg/dL (ref 8–23)
CO2: 24 mmol/L (ref 22–32)
Calcium: 9.1 mg/dL (ref 8.9–10.3)
Chloride: 102 mmol/L (ref 98–111)
Creatinine, Ser: 0.58 mg/dL (ref 0.44–1.00)
GFR, Estimated: >60 mL/min (ref >60)
Glucose, Bld: 107 mg/dL — ABNORMAL HIGH (ref 70–99)
Potassium: 3.6 mmol/L (ref 3.5–5.1)
Sodium: 139 mmol/L (ref 135–145)
Total Bilirubin: 0.6 mg/dL (ref 0.3–1.2)
Total Protein: 7.6 g/dL (ref 6.5–8.1)

## 2022-06-04 NOTE — ED Triage Notes (Signed)
Pt here with rectal bleeding since Thurs. Pt states she had 2 episodes of bloody stool (between light and dark in color) with clots. Pt has abd cramping but not pain. Pt denies NVD.

## 2022-06-04 NOTE — ED Provider Notes (Signed)
[facility] Provider Note    Event Date/Time   First MD Initiated Contact with Patient 06/04/22 1413     (approximate)   History   Rectal Bleeding   HPI {Remember to add pertinent medical, surgical, social, and/or OB history to HPI:1} Shelley Flowers is a 62 y.o. female  ***       Physical Exam   Triage Vital Signs: ED Triage Vitals  Enc Vitals Group     BP 06/04/22 1223 (!) 181/90     Pulse Rate 06/04/22 1223 84     Resp 06/04/22 1223 18     Temp 06/04/22 1224 98.4 F (36.9 C)     Temp Source 06/04/22 1224 Oral     SpO2 06/04/22 1223 100 %     Weight --      Height --      Head Circumference --      Peak Flow --      Pain Score 06/04/22 1224 0     Pain Loc --      Pain Edu? --      Excl. in [place]? --     Most recent vital signs: Vitals:   06/04/22 1300 06/04/22 1330  BP: 135/73 (!) 141/85  Pulse: 72 70  Resp:  17  Temp:    SpO2: 98% 98%    {Only need to document appropriate and relevant physical exam:1} General: Awake, no distress. *** CV:  Good peripheral perfusion. *** Resp:  Normal effort. *** Abd:  No distention. *** Other:  ***   ED Results / Procedures / Treatments   Labs (all labs ordered are listed, but only abnormal results are displayed) Labs Reviewed  CBC - Abnormal; Notable for the following components:      Result Value   Hemoglobin 11.6 (*)    All other components within normal limits  COMPREHENSIVE METABOLIC PANEL - Abnormal; Notable for the following components:   Glucose, Bld 107 (*)    All other components within normal limits     EKG  ***   RADIOLOGY *** {USE THE WORD "INTERPRETED"!! You MUST document your own interpretation of imaging, as well as the fact that you reviewed the radiologist's report!:1}   PROCEDURES:  Critical Care performed: {CriticalCareYesNo:19197::"Yes, see critical care procedure note(s)","No"}  Procedures   MEDICATIONS ORDERED IN ED: Medications - No data to  display   IMPRESSION / MDM / [place] / ED COURSE  I reviewed the triage vital signs and the nursing notes.                              Differential diagnosis includes, but is not limited to, ***  Patient's presentation is most consistent with {EM COPA:27473}  *** {If the patient is on the monitor, remove the brackets and asterisks on the sentence below and remember to document it as a Procedure as well. Otherwise delete the sentence below:1} {**The patient is on the cardiac monitor to evaluate for evidence of arrhythmia and/or significant heart rate changes.**} {Remember to include, when applicable, any/all of the following data: independent review of imaging independent review of labs (comment specifically on pertinent positives and negatives) review of specific prior hospitalizations, PCP/specialist notes, etc. discuss meds given and prescribed document any discussion with consultants (including hospitalists) any clinical decision tools you used and why (PECARN, NEXUS, etc.) did you consider admitting the patient? document social determinants of health affecting  patient's care (homelessness, inability to follow up in a timely fashion, etc) document any pre-existing conditions increasing risk on current visit (e.g. diabetes and HTN increasing danger of high-risk chest pain/ACS) describes what meds you gave (especially parenteral) and why any other interventions?:1}     FINAL CLINICAL IMPRESSION(S) / ED DIAGNOSES   Final diagnoses:  None     Rx / DC Orders   ED Discharge Orders     None        Note:  This document was prepared using Dragon voice recognition software and may include unintentional dictation errors.

## 2022-06-12 ENCOUNTER — Encounter: Payer: Self-pay | Admitting: General Practice

## 2022-06-24 ENCOUNTER — Ambulatory Visit: Payer: BC Managed Care – PPO | Admitting: Physician Assistant

## 2022-06-24 ENCOUNTER — Encounter: Payer: Self-pay | Admitting: Physician Assistant

## 2022-06-24 VITALS — BP 130/82 | HR 80 | Temp 98.0°F | Ht 60.0 in | Wt 149.0 lb

## 2022-06-24 DIAGNOSIS — D649 Anemia, unspecified: Secondary | ICD-10-CM | POA: Diagnosis not present

## 2022-06-24 DIAGNOSIS — E785 Hyperlipidemia, unspecified: Secondary | ICD-10-CM | POA: Insufficient documentation

## 2022-06-24 DIAGNOSIS — K219 Gastro-esophageal reflux disease without esophagitis: Secondary | ICD-10-CM

## 2022-06-24 DIAGNOSIS — K589 Irritable bowel syndrome without diarrhea: Secondary | ICD-10-CM | POA: Insufficient documentation

## 2022-06-24 DIAGNOSIS — M62838 Other muscle spasm: Secondary | ICD-10-CM

## 2022-06-24 DIAGNOSIS — K625 Hemorrhage of anus and rectum: Secondary | ICD-10-CM | POA: Diagnosis not present

## 2022-06-24 DIAGNOSIS — K581 Irritable bowel syndrome with constipation: Secondary | ICD-10-CM | POA: Diagnosis not present

## 2022-06-24 DIAGNOSIS — G4440 Drug-induced headache, not elsewhere classified, not intractable: Secondary | ICD-10-CM

## 2022-06-24 DIAGNOSIS — M7989 Other specified soft tissue disorders: Secondary | ICD-10-CM

## 2022-06-24 DIAGNOSIS — R202 Paresthesia of skin: Secondary | ICD-10-CM

## 2022-06-24 DIAGNOSIS — T3995XA Adverse effect of unspecified nonopioid analgesic, antipyretic and antirheumatic, initial encounter: Secondary | ICD-10-CM

## 2022-06-24 DIAGNOSIS — R7303 Prediabetes: Secondary | ICD-10-CM

## 2022-06-24 MED ORDER — LINZESS 145 MCG PO CAPS: 145.0000 ug | ORAL_CAPSULE | Freq: Every day | ORAL | 2 refills | Status: DC

## 2022-06-24 MED ORDER — ATORVASTATIN CALCIUM 20 MG PO TABS: 20.0000 mg | ORAL_TABLET | Freq: Every day | ORAL | 1 refills | Status: DC

## 2022-06-24 MED ORDER — OMEPRAZOLE MAGNESIUM 20 MG PO TBEC: 20.0000 mg | DELAYED_RELEASE_TABLET | Freq: Every day | ORAL | 1 refills | Status: DC

## 2022-06-24 MED ORDER — CYCLOBENZAPRINE HCL 10 MG PO TABS: 10.0000 mg | ORAL_TABLET | Freq: Three times a day (TID) | ORAL | 0 refills | Status: DC | PRN

## 2022-06-24 NOTE — Progress Notes (Signed)
Date:  06/24/2022   Name:  Shelley Flowers   DOB:  04/03/1960   MRN:  [id number]   Chief Complaint: Establish Care, Insomnia (Leg throbs at night left), and Hemorrhoids  HPI Shelley Flowers is a very pleasant 62 year old female with history of HTN, HLD, GERD, IBS-C, prediabetes who presents today to establish care and address a few of her chronic concerns.   She was recently evaluated in the ED 06/04/2022 for acute-on-chronic rectal bleeding suspected to be related to hemorrhoids found on exam and consistent with history of chronic constipation.  I have reviewed this note in its entirety.  Labs consistent with a chronic borderline anemia, presumably iron deficiency.  She has never had a colonoscopy, but is scheduled for GI consult 09/30/2022.  She has not seen any blood in the stool since evaluation at the ED several weeks ago.    She has prior diagnosis of IBS-C as made by her previous PCP Franco Nones, NP who prescribed Linzess with good effect.  Importantly, patient has been "stretching out" her prescription medications due to difficulty finding PCP.  For this reason she has been taking Linzess roughly once per week, similar story with atorvastatin.  Continues to complain of constipation with infrequent, firm stools for which she often has to strain but obtains relief with Linzess.  Last BM earlier this morning 3 AM.  Continues to complain of uncontrolled GERD for which she takes omeprazole 20 mg.  Historically she has used 1 tablet daily, but more recently a "teledoc" advised her to take 2 tablets omeprazole in the morning and 2 tablets of Pepcid at night.  Importantly, in addition to her medication list on today's visit, she takes Excedrin Migraine for daily headaches, 2 tablets 3-4 times every day.  She will also occasionally use 2 tablets of ibuprofen as needed for sinus pain.  Endorses leg pain, L>R especially at night which is disruptive to sleep.  She describes it "like a tooth ache", throbbing  and mainly involving anterior left thigh, sometimes with tingling in the lower leg/foot.  Endorses occasional muscle spasms in the leg, so she has been taking daily potassium and magnesium to help.  She has been taking 2 Benadryl's every night to help with sleep. Mentions a "knot" in the arch of left foot     Recent Labs     Component Value Date/Time   NA 139 06/04/2022 1227   NA 138 04/13/2014 1612   K 3.6 06/04/2022 1227   K 3.5 04/13/2014 1612   CL 102 06/04/2022 1227   CL 103 04/13/2014 1612   CO2 24 06/04/2022 1227   CO2 29 04/13/2014 1612   GLUCOSE 107 (H) 06/04/2022 1227   GLUCOSE 106 (H) 04/13/2014 1612   BUN 11 06/04/2022 1227   BUN 11 04/13/2014 1612   CREATININE 0.58 06/04/2022 1227   CREATININE 0.73 04/13/2014 1612   CALCIUM 9.1 06/04/2022 1227   CALCIUM 9.4 04/13/2014 1612   PROT 7.6 06/04/2022 1227   PROT 8.4 (H) 04/13/2014 1612   ALBUMIN 3.9 06/04/2022 1227   ALBUMIN 3.9 04/13/2014 1612   AST 27 06/04/2022 1227   AST 32 04/13/2014 1612   ALT 25 06/04/2022 1227   ALT 36 04/13/2014 1612   ALKPHOS 47 06/04/2022 1227   ALKPHOS 51 04/13/2014 1612   BILITOT 0.6 06/04/2022 1227   BILITOT 0.7 04/13/2014 1612   GFRNONAA >60 06/04/2022 1227   GFRNONAA >60 04/13/2014 1612   GFRNONAA >60 06/19/2013 2146   GFRAA >  60 04/19/2019 2123   GFRAA >60 04/13/2014 1612   GFRAA >60 06/19/2013 2146    Lab Results  Component Value Date   WBC 4.8 06/04/2022   HGB 11.6 (L) 06/04/2022   HCT 37.2 06/04/2022   MCV 93.2 06/04/2022   PLT 318 06/04/2022   No results found for: "HGBA1C" No results found for: "CHOL", "HDL", "LDLCALC", "LDLDIRECT", "TRIG", "CHOLHDL" No results found for: "TSH"  Review of Systems  Respiratory:  Negative for cough and shortness of breath.   Cardiovascular:  Negative for chest pain and leg swelling.  Gastrointestinal:  Positive for constipation. Negative for abdominal pain, blood in stool, diarrhea and vomiting.       Acid reflux  Musculoskeletal:   Positive for myalgias.  Neurological:  Positive for headaches.    There are no problems to display for this patient.   No Known Allergies  Past Surgical History:  Procedure Laterality Date   CESAREAN SECTION      Social History   Tobacco Use   Smoking status: Never   Smokeless tobacco: Never  Substance Use Topics   Alcohol use: No   Drug use: No     Medication list has been reviewed and updated.  Current Meds  Medication Sig   atorvastatin (LIPITOR) 20 MG tablet Take by mouth.   diclofenac (CATAFLAM) 50 MG tablet Take 50 mg by mouth.   LINZESS 145 MCG CAPS capsule Take 145 mcg by mouth daily.   olmesartan-hydrochlorothiazide (BENICAR HCT) 40-25 MG tablet Take 1 tablet by mouth daily.   omeprazole (PRILOSEC OTC) 20 MG tablet Take 20 mg by mouth daily.   ondansetron (ZOFRAN ODT) 4 MG disintegrating tablet Allow 1-2 tablets to dissolve in your mouth every 8 hours as needed for nausea/vomiting       06/24/2022    9:36 AM  GAD 7 : Generalized Anxiety Score  Nervous, Anxious, on Edge 0  Control/stop worrying 1  Worry too much - different things 1  Trouble relaxing 3  Restless 3  Easily annoyed or irritable 3  Afraid - awful might happen 0  Total GAD 7 Score 11       06/24/2022    9:36 AM  Depression screen PHQ 2/9  Decreased Interest 1  Down, Depressed, Hopeless 0  PHQ - 2 Score 1  Altered sleeping 3  Tired, decreased energy 2  Change in appetite 1  Feeling bad or failure about yourself  0  Trouble concentrating 0  Moving slowly or fidgety/restless 0  Suicidal thoughts 0  PHQ-9 Score 7  Difficult doing work/chores Not difficult at all    BP Readings from Last 3 Encounters:  06/24/22 130/82  06/04/22 137/88  05/09/22 (!) 143/79    Wt Readings from Last 3 Encounters:  06/24/22 149 lb (67.6 kg)  05/09/22 140 lb (63.5 kg)  08/06/21 140 lb (63.5 kg)    BP 130/82   Pulse 80   Temp 98 F (36.7 C)   Ht 5' (1.524 m)   Wt 149 lb (67.6 kg)   LMP  12/12/2010   SpO2 98%   BMI 29.10 kg/m   Physical Exam Vitals and nursing note reviewed.  Constitutional:      Appearance: Normal appearance.  Cardiovascular:     Rate and Rhythm: Normal rate and regular rhythm.     Heart sounds: Normal heart sounds. No murmur heard.    No friction rub. No gallop.     Comments: Pulses 2+ at radial, DP, PT bilaterally.  Buerger's test negative Pulmonary:     Effort: Pulmonary effort is normal.     Breath sounds: Normal breath sounds.  Abdominal:     General: Bowel sounds are normal.     Palpations: Abdomen is soft. There is no mass.     Tenderness: There is no abdominal tenderness.     Comments: No epigastric tenderness  Musculoskeletal:     Right lower leg: No edema.     Left lower leg: No edema.       Feet:  Feet:     Comments: Firm, slightly tender, somewhat mobile rounded 1-2cm mass along the arch of the left foot, just medial of midline. Neurological:     Sensory: Sensation is intact.     Gait: Gait is intact.     Comments: Straight leg raise negative bilaterally      Assessment and Plan:  1. Rectal bleeding Seems to have resolved so far.  We do have a good reason for rectal bleeding given her chronic constipation and frequent use of NSAIDs including aspirin.  Education provided to patient today.  She still needs to complete colonoscopy, keep appointment for July 2024.  2. Anemia, unspecified type Borderline, chronic, normocytic. Iron deficiency suspected with chronic small-volume blood loss, which could also contribute to some of her nocturnal leg sensations (RLS?).  Could also be B12/B9 deficiency given the paresthesias. - CBC with Differential/Platelet - Iron, TIBC and Ferritin Panel - B12 and Folate Panel - Comprehensive metabolic panel  3. Irritable bowel syndrome with constipation Seems like Linzess is effective at the current dose.  Agreed to refill this prescription until she can get established with GI.  Encouraged her  to take it every day - LINZESS 145 MCG CAPS capsule; Take 1 capsule (145 mcg total) by mouth daily.  Dispense: 30 capsule; Refill: 2  4. Gastroesophageal reflux disease, unspecified whether esophagitis present Continue Prilosec 20 mg daily.  Suspect NSAIDs contributory.  Stop diclofenac, stop ibuprofen if possible, plan to taper Excedrin Migraine over the course of several weeks as tolerated by patient.    Explained that 2 tablets of Excedrin Migraine at the equivalent of a strong cup of coffee, which she is having 3-4 times per day.  Not only can this contribute to her sleep difficulty, but may also be exacerbating her GERD due to the caffeine and NSAID intake.  She agrees with the current plan  - Magnesium - omeprazole (PRILOSEC OTC) 20 MG tablet; Take 1 tablet (20 mg total) by mouth daily.  Dispense: 90 tablet; Refill: 1  5. Bilateral leg paresthesia Check for laboratory causes of paresthesia including anemia, vitamin deficiency,  diabetes.  - Iron, TIBC and Ferritin Panel - B12 and Folate Panel - Magnesium - Hemoglobin A1c  6. Leg muscle spasm Unclear etiology at this time.  Consider high caffeine consumption (through Excedrin) as one possible explanation.  Also consider hypomagnesemia, possibly induced by PPI use.  For now, advised patient stop supplemental potassium and magnesium, check magnesium levels next labs.   Patient was confused about her "muscle relaxer" - thought this was diclofenac.  Explained that diclofenac is for pain, so we will stop this and use Flexeril instead which she has used previously.  - Magnesium - cyclobenzaprine (FLEXERIL) 10 MG tablet; Take 1 tablet (10 mg total) by mouth 3 (three) times daily as needed for muscle spasms.  Dispense: 30 tablet; Refill: 0  7. Analgesic rebound headache Almost definitely her daily headaches are related to rebound from chronic  excessive use of Excedrin Migraine.  Rebound could be from analgesic or caffeine.  Total daily  caffeine intake is quite high  8. Hyperlipidemia, unspecified hyperlipidemia type Patient wonders if she still needs to continue atorvastatin.  No recent checks of lipids.  Patient not fasting today, so she would like to defer all lab work until next week when she will return for her labs to be drawn. - Lipid panel - Comprehensive metabolic panel - atorvastatin (LIPITOR) 20 MG tablet; Take 1 tablet (20 mg total) by mouth daily.  Dispense: 90 tablet; Refill: 1  9. Prediabetes Check A1c - Comprehensive metabolic panel - Hemoglobin A1c  10. 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 Could cause localized nerve sensations but unlikely to cause sensations proximally (e.g. thigh). Consider imaging in future.   Ultimately, explained to Murdis that a good number of her symptoms could be related to overuse and interactions between medications, many of which are OTC. Prescribing cascade also likely a part of this. Will try to simplify medication burden for both her prescription medications and OTC meds.   >60 minutes provider time today reviewing external notes/labs, addressing multiple acute and chronic conditions, patient education on several topics, careful medication review, prescription management, ordering of >5 labs.   F/u 4wk OV GERD, IBS, bleeding, consider L foot XR  Partially dictated using Animal nutritionist. Any errors are unintentional.  Alvester Morin, PA-C, DMSc, Nutritionist [facility] [phone]

## 2022-07-01 DIAGNOSIS — Z13220 Encounter for screening for lipoid disorders: Secondary | ICD-10-CM | POA: Diagnosis not present

## 2022-07-01 DIAGNOSIS — R7303 Prediabetes: Secondary | ICD-10-CM | POA: Diagnosis not present

## 2022-07-01 DIAGNOSIS — D649 Anemia, unspecified: Secondary | ICD-10-CM | POA: Diagnosis not present

## 2022-07-01 DIAGNOSIS — R202 Paresthesia of skin: Secondary | ICD-10-CM | POA: Diagnosis not present

## 2022-07-01 DIAGNOSIS — E785 Hyperlipidemia, unspecified: Secondary | ICD-10-CM | POA: Diagnosis not present

## 2022-07-02 LAB — LIPID PANEL
Chol/HDL Ratio: 2.5 ratio (ref 0.0–4.4)
Cholesterol, Total: 180 mg/dL (ref 100–199)
HDL: 72 mg/dL (ref >39)
LDL Chol Calc (NIH): 96 mg/dL (ref 0–99)
Triglycerides: 64 mg/dL (ref 0–149)
VLDL Cholesterol Cal: 12 mg/dL (ref 5–40)

## 2022-07-02 LAB — CBC WITH DIFFERENTIAL/PLATELET
Basophils Absolute: 0 10*3/uL (ref 0.0–0.2)
Basos: 1 %
EOS (ABSOLUTE): 0.1 10*3/uL (ref 0.0–0.4)
Eos: 1 %
Hematocrit: 38.7 % (ref 34.0–46.6)
Hemoglobin: 12.3 g/dL (ref 11.1–15.9)
Immature Grans (Abs): 0 10*3/uL (ref 0.0–0.1)
Immature Granulocytes: 0 %
Lymphocytes Absolute: 1.3 10*3/uL (ref 0.7–3.1)
Lymphs: 37 %
MCH: 29.4 pg (ref 26.6–33.0)
MCHC: 31.8 g/dL (ref 31.5–35.7)
MCV: 93 fL (ref 79–97)
Monocytes Absolute: 0.3 10*3/uL (ref 0.1–0.9)
Monocytes: 8 %
Neutrophils Absolute: 1.8 10*3/uL (ref 1.4–7.0)
Neutrophils: 53 %
Platelets: 316 10*3/uL (ref 150–450)
RBC: 4.18 x10E6/uL (ref 3.77–5.28)
RDW: 12.5 % (ref 11.7–15.4)
WBC: 3.5 10*3/uL (ref 3.4–10.8)

## 2022-07-02 LAB — IRON,TIBC AND FERRITIN PANEL
Ferritin: 27 ng/mL (ref 15–150)
Iron Saturation: 16 % (ref 15–55)
Iron: 53 ug/dL (ref 27–139)
Total Iron Binding Capacity: 336 ug/dL (ref 250–450)
UIBC: 283 ug/dL (ref 118–369)

## 2022-07-02 LAB — COMPREHENSIVE METABOLIC PANEL WITH GFR
ALT: 17 [IU]/L (ref 0–32)
AST: 18 [IU]/L (ref 0–40)
Albumin/Globulin Ratio: 1.6 (ref 1.2–2.2)
Albumin: 4.6 g/dL (ref 3.9–4.9)
Alkaline Phosphatase: 52 [IU]/L (ref 44–121)
BUN/Creatinine Ratio: 11 — ABNORMAL LOW (ref 12–28)
BUN: 8 mg/dL (ref 8–27)
Bilirubin Total: 0.5 mg/dL (ref 0.0–1.2)
CO2: 23 mmol/L (ref 20–29)
Calcium: 9.5 mg/dL (ref 8.7–10.3)
Chloride: 104 mmol/L (ref 96–106)
Creatinine, Ser: 0.7 mg/dL (ref 0.57–1.00)
Globulin, Total: 2.8 g/dL (ref 1.5–4.5)
Glucose: 88 mg/dL (ref 70–99)
Potassium: 4.4 mmol/L (ref 3.5–5.2)
Sodium: 142 mmol/L (ref 134–144)
Total Protein: 7.4 g/dL (ref 6.0–8.5)
eGFR: 98 mL/min/{1.73_m2}

## 2022-07-02 LAB — MAGNESIUM: Magnesium: 2.1 mg/dL (ref 1.6–2.3)

## 2022-07-02 LAB — B12 AND FOLATE PANEL
Folate: 17 ng/mL (ref >3.0)
Vitamin B-12: 553 pg/mL (ref 232–1245)

## 2022-07-02 LAB — HEMOGLOBIN A1C
Est. average glucose Bld gHb Est-mCnc: 120 mg/dL
Hgb A1c MFr Bld: 5.8 % — ABNORMAL HIGH (ref 4.8–5.6)

## 2022-07-03 ENCOUNTER — Other Ambulatory Visit: Payer: Self-pay | Admitting: Physician Assistant

## 2022-07-04 NOTE — Progress Notes (Signed)
Please reassure patient that A1c of 5.8% is unrelated to her nerve sensation in the legs. I do not have a definite cause for the tingling and pain but I would be interested to see if it improves at all after slowly backing off the Excedrin, as excessive caffeine can sometimes do this. There are some medication options we can explore at future visits if desired.

## 2022-07-22 ENCOUNTER — Ambulatory Visit: Payer: BC Managed Care – PPO | Admitting: Physician Assistant

## 2022-08-17 ENCOUNTER — Other Ambulatory Visit: Payer: Self-pay

## 2022-08-17 ENCOUNTER — Emergency Department
Admission: EM | Admit: 2022-08-17 | Discharge: 2022-08-17 | Disposition: A | Discharge status: Home / Self Care | Payer: BC Managed Care – PPO | Attending: Emergency Medicine | Admitting: Emergency Medicine

## 2022-08-17 DIAGNOSIS — K625 Hemorrhage of anus and rectum: Secondary | ICD-10-CM | POA: Insufficient documentation

## 2022-08-17 LAB — COMPREHENSIVE METABOLIC PANEL
ALT: 21 U/L (ref 0–44)
AST: 23 U/L (ref 15–41)
Albumin: 4.4 g/dL (ref 3.5–5.0)
Alkaline Phosphatase: 42 U/L (ref 38–126)
Anion gap: 9 (ref 5–15)
BUN: 15 mg/dL (ref 8–23)
CO2: 24 mmol/L (ref 22–32)
Calcium: 9.2 mg/dL (ref 8.9–10.3)
Chloride: 102 mmol/L (ref 98–111)
Creatinine, Ser: 0.66 mg/dL (ref 0.44–1.00)
GFR, Estimated: >60 mL/min (ref >60)
Glucose, Bld: 106 mg/dL — ABNORMAL HIGH (ref 70–99)
Potassium: 3.3 mmol/L — ABNORMAL LOW (ref 3.5–5.1)
Sodium: 135 mmol/L (ref 135–145)
Total Bilirubin: 0.6 mg/dL (ref 0.3–1.2)
Total Protein: 7.7 g/dL (ref 6.5–8.1)

## 2022-08-17 LAB — CBC
HCT: 36.2 % (ref 36.0–46.0)
Hemoglobin: 11.5 g/dL — ABNORMAL LOW (ref 12.0–15.0)
MCH: 29.3 pg (ref 26.0–34.0)
MCHC: 31.8 g/dL (ref 30.0–36.0)
MCV: 92.1 fL (ref 80.0–100.0)
Platelets: 334 10*3/uL (ref 150–400)
RBC: 3.93 MIL/uL (ref 3.87–5.11)
RDW: 13 % (ref 11.5–15.5)
WBC: 5.5 10*3/uL (ref 4.0–10.5)
nRBC: 0 % (ref 0.0–0.2)

## 2022-08-17 LAB — TYPE AND SCREEN
ABO/RH(D): A POS
Antibody Screen: NEGATIVE

## 2022-08-17 NOTE — ED Notes (Signed)
Urine sent to lab if needed for UA

## 2022-08-17 NOTE — ED Triage Notes (Signed)
Pt reports she has been having bowels movements since 3:30pm. Pt reports there are blood clots with every BM. Blood clots dark red in color and when she wipes she notices blood is bright red. Pt reports she was told she has hemorrhoids in the past. Pt reports she had had about 6 episodes of BM since 3:30 pm. Pt talks in complete sentences no respiratory distress noted.

## 2022-08-17 NOTE — Discharge Instructions (Signed)
Follow-up with her gastroenterologist and get the colonoscopy as scheduled.  Follow-up with your doctor to recheck your blood levels within this 1 week.  If you experience any new, worsening, or unexpected symptoms as we discussed call your doctor right away or come back to the emergency department.  While you are experiencing rectal bleeding avoid aspirin, nonsteroidal anti-inflammatories like Motrin, Advil, ibuprofen, Aleve, naproxen

## 2022-08-17 NOTE — ED Provider Notes (Signed)
[facility] Provider Note    Event Date/Time   First MD Initiated Contact with Patient 08/17/22 2015     (approximate)   History   Rectal Bleeding   HPI  Shelley Flowers is a 62 y.o. female   Past medical history of hypertension, IBS, GERD, intermittent rectal bleeding for many years who presents emergency department with rectal bleeding.  No pain.  Noticed blood clots coming from her rectum today.  No nausea or vomiting.  No blood thinner use.  No urinary symptoms.  Has had intermittent rectal bleeding for many years and has establish care with gastroenterologist and states that she has a colonoscopy scheduled for next month.   External Medical Documents Reviewed: GI initial consultation note from 2015 she was seen for intermittent rectal bleeding and was scheduled for colonoscopy which she never followed through with.      Physical Exam   Triage Vital Signs: ED Triage Vitals  Enc Vitals Group     BP 08/17/22 1937 (!) 158/101     Pulse Rate 08/17/22 1937 77     Resp 08/17/22 1937 16     Temp 08/17/22 1937 98.2 F (36.8 C)     Temp Source 08/17/22 1937 Oral     SpO2 08/17/22 1937 99 %     Weight 08/17/22 1940 140 lb (63.5 kg)     Height 08/17/22 1940 5' (1.524 m)     Head Circumference --      Peak Flow --      Pain Score 08/17/22 1939 7     Pain Loc --      Pain Edu? --      Excl. in GC? --     Most recent vital signs: Vitals:   08/17/22 1937 08/17/22 2015  BP: (!) 158/101 (!) 152/90  Pulse: 77 66  Resp: 16 16  Temp: 98.2 F (36.8 C)   SpO2: 99% 100%    General: Awake, no distress.  CV:  Good peripheral perfusion.  Resp:  Normal effort.  Abd:  No distention.  Other:  Soft nontender abdomen to deep palpation all quadrants.  Skin appears warm well-perfused.  Awake alert oriented comfortable appearing with normal hemodynamics, slightly hypertensive.  Rectal exam shows scant red blood.   ED Results / Procedures / Treatments    Labs (all labs ordered are listed, but only abnormal results are displayed) Labs Reviewed  COMPREHENSIVE METABOLIC PANEL - Abnormal; Notable for the following components:      Result Value   Potassium 3.3 (*)    Glucose, Bld 106 (*)    All other components within normal limits  CBC - Abnormal; Notable for the following components:   Hemoglobin 11.5 (*)    All other components within normal limits  POC OCCULT BLOOD, ED  TYPE AND SCREEN     I ordered and reviewed the above labs they are notable for H&H stable from prior.  White blood cell count is normal    PROCEDURES:  Critical Care performed: No  Procedures   MEDICATIONS ORDERED IN ED: Medications - No data to display IMPRESSION / MDM / ASSESSMENT AND PLAN / ED COURSE  I reviewed the triage vital signs and the nursing notes.                                Patient's presentation is most consistent with acute presentation with potential threat to  life or bodily function.  Differential diagnosis includes, but is not limited to, lower GI bleeding, rectal bleeding, hemorrhoids, diverticulitis, malignancy, AVM, blood loss anemia, intra-abdominal infection   The patient is on the cardiac monitor to evaluate for evidence of arrhythmia and/or significant heart rate changes.  MDM:   Patient with chronic intermittent rectal bleeding with another episode today fortunately vital signs are normal, H&H is stable from prior, no hemorrhaging only scant blood on my rectal exam.  She has a colonoscopy scheduled for next month.  She is not on blood thinners.  She has a soft nontender abdomen so I doubt surgical abdominal pathology at this time.   I considered hospitalization for admission or observation however given her stability, normal H&H, and adequate follow-up think outpatient follow-up is most appropriate at this time with colonoscopy as scheduled.  She understands to return with new or worsening symptoms.        FINAL  CLINICAL IMPRESSION(S) / ED DIAGNOSES   Final diagnoses:  Rectal bleeding     Rx / DC Orders   ED Discharge Orders     None        Note:  This document was prepared using Dragon voice recognition software and may include unintentional dictation errors.    Pilar Jarvis, MD 08/17/22 [phone]

## 2022-08-19 ENCOUNTER — Ambulatory Visit: Payer: BC Managed Care – PPO | Admitting: Physician Assistant

## 2022-08-19 ENCOUNTER — Encounter: Payer: Self-pay | Admitting: Physician Assistant

## 2022-08-19 ENCOUNTER — Telehealth: Payer: Self-pay

## 2022-08-19 VITALS — BP 92/68 | HR 97 | Temp 98.2°F | Ht 60.0 in | Wt 151.0 lb

## 2022-08-19 DIAGNOSIS — K625 Hemorrhage of anus and rectum: Secondary | ICD-10-CM

## 2022-08-19 DIAGNOSIS — M25561 Pain in right knee: Secondary | ICD-10-CM | POA: Diagnosis not present

## 2022-08-19 DIAGNOSIS — I959 Hypotension, unspecified: Secondary | ICD-10-CM

## 2022-08-19 DIAGNOSIS — Z91148 Patient's other noncompliance with medication regimen for other reason: Secondary | ICD-10-CM

## 2022-08-19 NOTE — Transitions of Care (Post Inpatient/ED Visit) (Signed)
   08/19/2022  Name: Shelley Flowers MRN: [id number] DOB: Oct 13, 1960  Today's TOC FU Call Status: Today's TOC FU Call Status:: Successful TOC FU Call Competed TOC FU Call Complete Date: 08/19/22  Transition Care Management Follow-up Telephone Call Date of Discharge: 08/17/22 Discharge Facility: [facility]) Type of Discharge: Emergency Department Reason for ED Visit: Other: (Rectal Bleeding) How have you been since you were released from the hospital?: Better Any questions or concerns?: No  Items Reviewed: Did you receive and understand the discharge instructions provided?: Yes Medications obtained,verified, and reconciled?: Yes (Medications Reviewed) Any new allergies since your discharge?: No Dietary orders reviewed?: NA Do you have support at home?: Yes  Medications Reviewed Today: Medications Reviewed Today     Reviewed by Toretto Tingler, Eulis Canner, CMA (Certified Medical Assistant) on 08/19/22 at 1318  Med List Status: <None>   Medication Order Taking? Sig Documenting Provider Last Dose Status Informant  cyclobenzaprine (FLEXERIL) 10 MG tablet [id number]  Take 1 tablet (10 mg total) by mouth 3 (three) times daily as needed for muscle spasms. Remo Lipps, PA  Active   LINZESS 145 MCG CAPS capsule [id number]  Take 1 capsule (145 mcg total) by mouth daily. Remo Lipps, PA  Active   olmesartan-hydrochlorothiazide (BENICAR HCT) 40-25 MG tablet [id number]  Take 1 tablet by mouth daily. [provider]  Active Self  omeprazole (PRILOSEC OTC) 20 MG tablet [id number]  Take 1 tablet (20 mg total) by mouth daily. Remo Lipps, PA  Active   ondansetron (ZOFRAN ODT) 4 MG disintegrating tablet [id number]  Allow 1-2 tablets to dissolve in your mouth every 8 hours as needed for nausea/vomiting Loleta Rose, MD  Active             Home Care and Equipment/Supplies: Were Home Health Services Ordered?: No Any new equipment or medical supplies  ordered?: No  Functional Questionnaire: Do you need assistance with bathing/showering or dressing?: No Do you need assistance with meal preparation?: No Do you need assistance with eating?: No Do you have difficulty maintaining continence: No Do you need assistance with getting out of bed/getting out of a chair/moving?: No Do you have difficulty managing or taking your medications?: No  Follow up appointments reviewed: PCP Follow-up appointment confirmed?: No (pt did not want an appt she is doing better) Specialist Hospital Follow-up appointment confirmed?: NA Do you need transportation to your follow-up appointment?: No Do you understand care options if your condition(s) worsen?: Yes-patient verbalized understanding    SIGNATURE [name], CMA

## 2022-08-19 NOTE — Transitions of Care (Post Inpatient/ED Visit) (Deleted)
   08/19/2022  Name: Shelley Flowers MRN: [id number] DOB: 1960/09/25  Today's TOC FU Call Status: Today's TOC FU Call Status:: Unsuccessul Call (1st Attempt) Unsuccessful Call (1st Attempt) Date: 08/19/22  Attempted to reach the patient regarding the most recent Inpatient/ED visit.  Follow Up Plan: Additional outreach attempts will be made to reach the patient to complete the Transitions of Care (Post Inpatient/ED visit) call.   [employer], CMA

## 2022-08-19 NOTE — Progress Notes (Signed)
Date:  08/19/2022   Name:  Shelley Flowers   DOB:  1960/05/08   MRN:  [id number]   Chief Complaint: Leg Pain and Knee Pain  Leg Pain  Incident onset: X1 day. There was no injury mechanism. The pain is present in the right leg and right knee. The quality of the pain is described as aching, burning, cramping, shooting and stabbing. The pain is at a severity of 7/10. The pain is moderate. The pain has been Constant (comes and goes) since onset. Associated symptoms include a loss of motion. Associated symptoms comments: Can not bend knee at times . She reports no foreign bodies present. Exacerbated by: laying down. She has tried acetaminophen and heat (musle relaxer) for the symptoms. The treatment provided no relief.   Sargun returns today mainly for evaluation of acute onset right knee pain and stiffness since last night.  She states that she noticed it after lying supine in bed with legs fully extended for about 30 minutes while using a massage device on the feet.  Afterwards, she experienced constant aching pain and stiffness of the right knee.  It has slightly improved today, but she reports that she had very poor sleep last night as a result.   Unfortunately it seems she has still not taken the cyclobenzaprine prescribed last visit - she remains confused on this. It sounds like she maybe confused it with diclofenac again.   Also following up on recent ED visit 08/17/2022 for rectal bleeding.  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 While she reports reducing from 6 tablets daily to 2, she has been replacing it with [facility] powder which she did not realize is essentially the same thing. No further rectal bleeding. GI f/u scheduled for next month.    Medication list has been reviewed and updated.  Current Meds  Medication Sig   cyclobenzaprine (FLEXERIL) 10 MG tablet Take 1  tablet (10 mg total) by mouth 3 (three) times daily as needed for muscle spasms.   LINZESS 145 MCG CAPS capsule Take 1 capsule (145 mcg total) by mouth daily.   olmesartan-hydrochlorothiazide (BENICAR HCT) 40-25 MG tablet Take 1 tablet by mouth daily.   omeprazole (PRILOSEC) 20 MG capsule Take 20 mg by mouth daily.   ondansetron (ZOFRAN ODT) 4 MG disintegrating tablet Allow 1-2 tablets to dissolve in your mouth every 8 hours as needed for nausea/vomiting     Review of Systems  Gastrointestinal:  Positive for blood in stool (none for past 24h). Negative for rectal pain.  Musculoskeletal:  Positive for arthralgias and joint swelling.    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  Past Surgical History:  Procedure Laterality Date   CESAREAN SECTION      Social History   Tobacco Use   Smoking status: Never   Smokeless tobacco: Never  Substance Use Topics   Alcohol use: No   Drug use: No    History reviewed. No pertinent family history.  08/19/2022    1:33 PM 06/24/2022    9:36 AM  GAD 7 : Generalized Anxiety Score  Nervous, Anxious, on Edge 0 0  Control/stop worrying 0 1  Worry too much - different things 2 1  Trouble relaxing 3 3  Restless 3 3  Easily annoyed or irritable 3 3  Afraid - awful might happen 0 0  Total GAD 7 Score 11 11  Anxiety Difficulty Somewhat difficult        08/19/2022    1:33 PM 06/24/2022    9:36 AM  Depression screen PHQ 2/9  Decreased Interest 1 1  Down, Depressed, Hopeless 0 0  PHQ - 2 Score 1 1  Altered sleeping 3 3  Tired, decreased energy 3 2   Change in appetite 0 1  Feeling bad or failure about yourself  0 0  Trouble concentrating 0 0  Moving slowly or fidgety/restless 0 0  Suicidal thoughts 0 0  PHQ-9 Score 7 7  Difficult doing work/chores Not difficult at all Not difficult at all    BP Readings from Last 3 Encounters:  08/19/22 92/68  08/17/22 128/83  06/24/22 130/82    Wt Readings from Last 3 Encounters:  08/19/22 151 lb (68.5 kg)  08/17/22 140 lb (63.5 kg)  06/24/22 149 lb (67.6 kg)    BP 92/68 (BP Location: Left Arm, Patient Position: Sitting, Cuff Size: Large)   Pulse 97   Temp 98.2 F (36.8 C) (Oral)   Ht 5' (1.524 m)   Wt 151 lb (68.5 kg)   LMP 12/12/2010   SpO2 96%   BMI 29.49 kg/m   Physical Exam Vitals and nursing note reviewed.  Constitutional:      Appearance: Normal appearance.  Cardiovascular:     Rate and Rhythm: Normal rate.  Pulmonary:     Effort: Pulmonary effort is normal.  Abdominal:     General: There is no distension.  Musculoskeletal:        General: Normal range of motion.     Comments: RIGHT knee slightly edematous compared to left with full AROM and strength 5/5, though with mild pain at the extremes of resisted flexion and extension, gestures toward anterior knee. Neg ant/post drawer, neg pain with varus or valgus stress. No calf tenderness.   Skin:    General: Skin is warm and dry.  Neurological:     Mental Status: She is alert and oriented to person, place, and time.     Gait: Gait is intact.  Psychiatric:        Mood and Affect: Mood and affect normal.     Recent Labs     Component Value Date/Time   NA 135 08/17/2022 1941   NA 142 07/01/2022 0847   NA 138 04/13/2014 1612   K 3.3 (L) 08/17/2022 1941   K 3.5 04/13/2014 1612   CL 102 08/17/2022 1941   CL 103 04/13/2014 1612   CO2 24 08/17/2022 1941   CO2 29 04/13/2014 1612   GLUCOSE 106 (H) 08/17/2022 1941   GLUCOSE 106 (H) 04/13/2014 1612   BUN 15 08/17/2022 1941   BUN 8 07/01/2022 0847   BUN 11  04/13/2014 1612   CREATININE 0.66 08/17/2022 1941   CREATININE 0.73 04/13/2014 1612   CALCIUM 9.2 08/17/2022 1941   CALCIUM 9.4 04/13/2014 1612   PROT 7.7 08/17/2022 1941   PROT 7.4 07/01/2022 0847   PROT 8.4 (H) 04/13/2014 1612   ALBUMIN 4.4 08/17/2022 1941   ALBUMIN 4.6 07/01/2022  0847   ALBUMIN 3.9 04/13/2014 1612   AST 23 08/17/2022 1941   AST 32 04/13/2014 1612   ALT 21 08/17/2022 1941   ALT 36 04/13/2014 1612   ALKPHOS 42 08/17/2022 1941   ALKPHOS 51 04/13/2014 1612   BILITOT 0.6 08/17/2022 1941   BILITOT 0.5 07/01/2022 0847   BILITOT 0.7 04/13/2014 1612   GFRNONAA >60 08/17/2022 1941   GFRNONAA >60 04/13/2014 1612   GFRNONAA >60 06/19/2013 2146   GFRAA >60 04/19/2019 2123   GFRAA >60 04/13/2014 1612   GFRAA >60 06/19/2013 2146    Lab Results  Component Value Date   WBC 5.5 08/17/2022   HGB 11.5 (L) 08/17/2022   HCT 36.2 08/17/2022   MCV 92.1 08/17/2022   PLT 334 08/17/2022   Lab Results  Component Value Date   HGBA1C 5.8 (H) 07/01/2022   Lab Results  Component Value Date   CHOL 180 07/01/2022   HDL 72 07/01/2022   LDLCALC 96 07/01/2022   TRIG 64 07/01/2022   CHOLHDL 2.5 07/01/2022   No results found for: "TSH"   Assessment and Plan:  1. Rectal bleeding Once again advised patient to stop aspirin, which she continues to take through Excedrin and BC powder.  Reviewed with her that Excedrin and BC are basically the same thing -was surprised to hear this.  Follow-up with GI as planned  2. Acute pain of right knee Seems to be resolving without further intervention.  No need for x-ray at this time.  Reviewed with patient I suspect this to be related to lying supine with knees locked in extension for 30+ minutes, advised that if she is going to do this again she should provide some type of support underneath the knee.  3. Hypotension, unspecified hypotension type Patient has asymptomatic hypotension today, confirmed by provider x 2.  Chart review shows labile  blood pressures as high as 180 systolic.  Patient reports good compliance with her current medication.  I wonder if recent hypotension is related to recent decrease in caffeine/NSAID consumption, causing relatively unopposed antihypertensive effect. Advised patient record BP readings qam and qpm for the next week for follow-up.  Stressed the importance of bringing each and every medication to our next visit including all OTC vitamins, supplements, meds, as well as prescriptions new and old. No major bleeding to suggest hypovolemia - very mild anemia on recent ED labs consistent with her history. Well-hydrated today per her report.  Consider dose reduction of olmesartan-HCTZ next time.   4. Overuse of medication Again carefully reviewed not to overuse OTC medications. Continue working on Excedrin/BC taper due to medication overuse headaches. Patient advised to always check with healthcare provider before starting a new daily OTC med. Will need to read her own labels in the future.    Return in about 1 week (around 08/26/2022) for OV f/u hypotension, knee.   Partially dictated using Animal nutritionist. Any errors are unintentional.  Alvester Morin, PA-C, DMSc, Nutritionist [facility] Primary Care and Sports Medicine [facility] [phone]

## 2022-08-26 ENCOUNTER — Ambulatory Visit: Payer: BC Managed Care – PPO | Admitting: Physician Assistant

## 2022-08-26 ENCOUNTER — Encounter: Payer: Self-pay | Admitting: Physician Assistant

## 2022-08-26 VITALS — BP 128/78 | HR 80 | Temp 98.6°F | Ht 60.0 in | Wt 154.0 lb

## 2022-08-26 DIAGNOSIS — M62838 Other muscle spasm: Secondary | ICD-10-CM

## 2022-08-26 DIAGNOSIS — I959 Hypotension, unspecified: Secondary | ICD-10-CM | POA: Diagnosis not present

## 2022-08-26 DIAGNOSIS — R110 Nausea: Secondary | ICD-10-CM

## 2022-08-26 MED ORDER — ONDANSETRON 4 MG PO TBDP: ORAL_TABLET | ORAL | 1 refills | Status: DC

## 2022-08-26 MED ORDER — CYCLOBENZAPRINE HCL 10 MG PO TABS: 10.0000 mg | ORAL_TABLET | Freq: Every day | ORAL | 1 refills | Status: DC

## 2022-08-26 NOTE — Progress Notes (Signed)
Date:  08/26/2022   Name:  Shelley Flowers   DOB:  [birth year]   MRN:  [id number]   Chief Complaint: Knee Pain ("It is fine") and Hypertension (130 80 in morning and 124 70 in evening)  HPI Shelley Flowers presents for 1 wk f/u on right knee pain now resolved and also hypotension seen last visit. She has been keeping BP log for me with most readings 120-135/70-80 mmHg, good compliance with olmesartan-hydrochlorothiazide.   Continues to reduce Excedrin use, stopped BC completely. Now uses Excedrin migraine 2 tablets once daily for headache. Notes that her headaches are getting less severe and less frequent.   No recent rectal bleeding.   Cyclobenzaprine helped with nocturnal leg cramps and sleep. Still having the dull aching pains when supine as discussed in April. Does not do any stretching before bed.   Running low on Zofran which she only uses PRN when her GERD flares up and makes her nauseated.   Medication list has been reviewed and updated.  Current Meds  Medication Sig   aspirin-acetaminophen-caffeine (EXCEDRIN MIGRAINE) 250-250-65 MG tablet Take 2 tablets by mouth daily as needed for headache.   LINZESS 145 MCG CAPS capsule Take 1 capsule (145 mcg total) by mouth daily.   olmesartan-hydrochlorothiazide (BENICAR HCT) 40-25 MG tablet Take 1 tablet by mouth daily.   omeprazole (PRILOSEC) 20 MG capsule Take 20 mg by mouth daily.   [DISCONTINUED] ondansetron (ZOFRAN ODT) 4 MG disintegrating tablet Allow 1-2 tablets to dissolve in your mouth every 8 hours as needed for nausea/vomiting     Review of Systems  Constitutional:  Negative for fatigue and fever.  Respiratory:  Negative for chest tightness and shortness of breath.   Cardiovascular:  Negative for chest pain and palpitations.  Gastrointestinal:  Negative for abdominal pain.  Musculoskeletal:  Positive for myalgias (legs aching when supine).    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  Past Surgical History:  Procedure Laterality Date   CESAREAN SECTION      Social History   Tobacco Use   Smoking status: Never   Smokeless tobacco: Never  Substance Use Topics   Alcohol use: No   Drug use: No    No family history on file.      08/26/2022    1:30 PM 08/19/2022    1:33 PM 06/24/2022    9:36 AM  GAD 7 : Generalized Anxiety Score  Nervous, Anxious, on Edge 0 0 0  Control/stop worrying 0 0 1  Worry too much - different things 0 2 1  Trouble relaxing 0 3 3  Restless 0 3 3  Easily annoyed or irritable 0 3 3  Afraid - awful might happen 0 0 0  Total GAD 7 Score 0 11 11  Anxiety Difficulty Not difficult at all Somewhat difficult        08/26/2022    1:30 PM 08/19/2022    1:33 PM 06/24/2022    9:36 AM  Depression screen PHQ 2/9  Decreased Interest 0 1 1  Down, Depressed, Hopeless 0 0 0  PHQ - 2 Score 0  1 1  Altered sleeping 2 3 3   Tired, decreased energy 0 3 2  Change in appetite 0 0 1  Feeling bad or failure about yourself  0 0 0  Trouble concentrating 0 0 0  Moving slowly or fidgety/restless 0 0 0  Suicidal thoughts 0 0 0  PHQ-9 Score 2 7 7   Difficult doing work/chores Not difficult at all Not difficult at all Not difficult at all    BP Readings from Last 3 Encounters:  08/26/22 128/78  08/19/22 92/68  08/17/22 128/83    Wt Readings from Last 3 Encounters:  08/26/22 154 lb (69.9 kg)  08/19/22 151 lb (68.5 kg)  08/17/22 140 lb (63.5 kg)    BP 128/78   Pulse 80   Temp 98.6 F (37 C) (Oral)   Ht 5' (1.524 m)   Wt 154 lb (69.9 kg)   LMP 12/12/2010   SpO2 98%   BMI 30.08 kg/m    Physical Exam Vitals and nursing note reviewed.  Constitutional:      Appearance: Normal appearance.  Cardiovascular:     Rate and Rhythm: Normal rate.  Pulmonary:     Effort: Pulmonary effort is normal.  Abdominal:     General: There is no distension.  Musculoskeletal:        General: Normal range of motion.  Skin:    General: Skin is warm and dry.  Neurological:     Mental Status: She is alert and oriented to person, place, and time.     Gait: Gait is intact.  Psychiatric:        Mood and Affect: Mood and affect normal.     Recent Labs     Component Value Date/Time   NA 135 08/17/2022 1941   NA 142 07/01/2022 0847   NA 138 04/13/2014 1612   K 3.3 (L) 08/17/2022 1941   K 3.5 04/13/2014 1612   CL 102 08/17/2022 1941   CL 103 04/13/2014 1612   CO2 24 08/17/2022 1941   CO2 29 04/13/2014 1612   GLUCOSE 106 (H) 08/17/2022 1941   GLUCOSE 106 (H) 04/13/2014 1612   BUN 15 08/17/2022 1941   BUN 8 07/01/2022 0847   BUN 11 04/13/2014 1612   CREATININE 0.66 08/17/2022 1941   CREATININE 0.73 04/13/2014 1612   CALCIUM 9.2 08/17/2022 1941   CALCIUM 9.4 04/13/2014 1612   PROT 7.7 08/17/2022 1941   PROT 7.4 07/01/2022 0847   PROT 8.4 (H) 04/13/2014 1612   ALBUMIN 4.4 08/17/2022 1941   ALBUMIN 4.6 07/01/2022 0847   ALBUMIN 3.9 04/13/2014 1612   AST 23 08/17/2022 1941   AST 32 04/13/2014 1612   ALT 21 08/17/2022 1941   ALT 36 04/13/2014 1612   ALKPHOS 42 08/17/2022 1941   ALKPHOS 51 04/13/2014 1612   BILITOT 0.6 08/17/2022 1941   BILITOT 0.5 07/01/2022 0847   BILITOT 0.7 04/13/2014 1612   GFRNONAA >60 08/17/2022 1941   GFRNONAA >60 04/13/2014 1612   GFRNONAA >60 06/19/2013 2146   GFRAA >60 04/19/2019 2123   GFRAA >60 04/13/2014 1612   GFRAA >60 06/19/2013 2146    Lab Results  Component Value Date   WBC 5.5 08/17/2022   HGB 11.5 (L) 08/17/2022   HCT 36.2 08/17/2022   MCV 92.1 08/17/2022   PLT 334 08/17/2022   Lab Results  Component Value Date   HGBA1C 5.8  (H) 07/01/2022   Lab Results  Component Value Date   CHOL 180 07/01/2022   HDL  72 07/01/2022   LDLCALC 96 07/01/2022   TRIG 64 07/01/2022   CHOLHDL 2.5 07/01/2022   No results found for: "TSH"   Assessment and Plan:  1. Hypotension, unspecified hypotension type Resolved, continue current medication as prescribed.   2. Leg muscle spasm Continue cyclobenzaprine nightly. Try stretching the legs before bed time.  - cyclobenzaprine (FLEXERIL) 10 MG tablet; Take 1 tablet (10 mg total) by mouth at bedtime.  Dispense: 90 tablet; Refill: 1  3. Nausea Refill ondansetron for PRN use.  - ondansetron (ZOFRAN ODT) 4 MG disintegrating tablet; Allow 1-2 tablets to dissolve in your mouth every 8 hours as needed for nausea/vomiting  Dispense: 30 tablet; Refill: 1   Return in about 3 months (around 11/26/2022) for OV chronic conditions.   Partially dictated using Animal nutritionist. Any errors are unintentional.  Alvester Morin, PA-C, DMSc, Nutritionist [facility] Primary Care and Sports Medicine [facility] [phone]

## 2022-09-09 ENCOUNTER — Telehealth: Payer: Self-pay

## 2022-09-09 ENCOUNTER — Telehealth: Payer: Self-pay | Admitting: Physician Assistant

## 2022-09-09 NOTE — Telephone Encounter (Signed)
Called pt. Pt is due for a mammogram calling to see if pt wants to get one. Will place order if in she agrees. Location is downstairs in the in same building or can be done at [facility].  KP

## 2022-09-09 NOTE — Telephone Encounter (Signed)
Copied from CRM [id number]. Topic: Appointment Scheduling - Scheduling Inquiry for Clinic >> Sep 09, 2022  3:43 PM Carrielelia G wrote: Yes: Mammo Appt: patient would like to go to [facility].

## 2022-09-10 ENCOUNTER — Other Ambulatory Visit: Payer: Self-pay

## 2022-09-10 DIAGNOSIS — Z1231 Encounter for screening mammogram for malignant neoplasm of breast: Secondary | ICD-10-CM

## 2022-09-10 NOTE — Telephone Encounter (Signed)
Mammogram ordered.  KP

## 2022-09-30 ENCOUNTER — Encounter: Payer: Self-pay | Admitting: Gastroenterology

## 2022-09-30 ENCOUNTER — Ambulatory Visit: Payer: BC Managed Care – PPO | Admitting: Gastroenterology

## 2022-10-21 ENCOUNTER — Other Ambulatory Visit: Payer: Self-pay | Admitting: Physician Assistant

## 2022-10-21 NOTE — Telephone Encounter (Signed)
Medication Refill - Medication:  olmesartan-hydrochlorothiazide (BENICAR HCT) 40-25 MG tablet   Has the patient contacted their pharmacy? No.  Preferred Pharmacy (with phone number or street name):  [id number] - [place],  - [address] Phone: [phone]  Fax: [phone]     Has the patient been seen for an appointment in the last year OR does the patient have an upcoming appointment? Yes.    Agent: Please be advised that RX refills may take up to 3 business days. We ask that you follow-up with your pharmacy.

## 2022-10-23 MED ORDER — OLMESARTAN MEDOXOMIL-HCTZ 40-25 MG PO TABS: 1.0000 | ORAL_TABLET | Freq: Every day | ORAL | 3 refills | Status: DC

## 2022-10-23 NOTE — Telephone Encounter (Signed)
Requested medication (s) are due for refill today: Yes  Requested medication (s) are on the active medication list: Yes  Last refill:  04/20/19  Future visit scheduled: No  Notes to clinic:  Unable to refill per protocol, last refill by another provider.      Requested Prescriptions  Pending Prescriptions Disp Refills   olmesartan-hydrochlorothiazide (BENICAR HCT) 40-25 MG tablet      Sig: Take 1 tablet by mouth daily.     Cardiovascular: ARB + Diuretic Combos Failed - 10/21/2022  3:55 PM      Failed - K in normal range and within 180 days    Potassium  Date Value Ref Range Status  08/17/2022 3.3 (L) 3.5 - 5.1 mmol/L Final  04/13/2014 3.5 3.5 - 5.1 mmol/L Final         Passed - Na in normal range and within 180 days    Sodium  Date Value Ref Range Status  08/17/2022 135 135 - 145 mmol/L Final  07/01/2022 142 134 - 144 mmol/L Final  04/13/2014 138 136 - 145 mmol/L Final         Passed - Cr in normal range and within 180 days    Creatinine  Date Value Ref Range Status  04/13/2014 0.73 0.60 - 1.30 mg/dL Final   Creatinine, Ser  Date Value Ref Range Status  08/17/2022 0.66 0.44 - 1.00 mg/dL Final         Passed - eGFR is 10 or above and within 180 days    EGFR (African American)  Date Value Ref Range Status  04/13/2014 >60 >54mL/min Final  06/19/2013 >60  Final   GFR calc Af Amer  Date Value Ref Range Status  04/19/2019 >60 >60 mL/min Final   EGFR (Non-African Amer.)  Date Value Ref Range Status  04/13/2014 >60 >27mL/min Final    Comment:    eGFR values <2mL/min/1.73 m2 may be an indication of chronic kidney disease (CKD). Calculated eGFR, using the MRDR Study equation, is useful in  patients with stable renal function. The eGFR calculation will not be reliable in acutely ill patients when serum creatinine is changing rapidly. It is not useful in patients on dialysis. The eGFR calculation may not be applicable to patients at the low and high extremes of body  sizes, pregnant women, and vegetarians.   06/19/2013 >60  Final    Comment:    eGFR values <57mL/min/1.73 m2 may be an indication of chronic kidney disease (CKD). Calculated eGFR is useful in patients with stable renal function. The eGFR calculation will not be reliable in acutely ill patients when serum creatinine is changing rapidly. It is not useful in  patients on dialysis. The eGFR calculation may not be applicable to patients at the low and high extremes of body sizes, pregnant women, and vegetarians.    GFR, Estimated  Date Value Ref Range Status  08/17/2022 >60 >60 mL/min Final    Comment:    (NOTE) Calculated using the CKD-EPI Creatinine Equation (2021)    eGFR  Date Value Ref Range Status  07/01/2022 98 >59 mL/min/1.73 Final         Passed - Patient is not pregnant      Passed - Last BP in normal range    BP Readings from Last 1 Encounters:  08/26/22 128/78         Passed - Valid encounter within last 6 months    Recent Outpatient Visits  1 month ago Hypotension, unspecified hypotension type   [facility] Primary Care & Sports Medicine at [facility], [place], [place]   2 months ago Rectal bleeding   [facility] Primary Care & Sports Medicine at [facility], [place], [place]   4 months ago Rectal bleeding   [facility] Primary Care & Sports Medicine at [facility], [place], [place]

## 2022-10-23 NOTE — Telephone Encounter (Signed)
Please advise 

## 2022-11-15 IMAGING — CT CT ABD-PELV W/O CM
2 of 4 series · 16 of 46 positions shown, 18 images · non-contrast
Comparison: 03/15/2017

CLINICAL DATA: Nausea and diarrhea

EXAM:
CT ABDOMEN AND PELVIS WITHOUT CONTRAST
TECHNIQUE: Multidetector CT imaging of the abdomen and pelvis was performed
following the standard protocol without IV contrast.

[Series 2: routine abd/pel wo · axial · 0.74mm/px · z∈[-875,-490]mm · 13 of 85 slices shown, 15 images]
[im 4/85  soft-tissue]
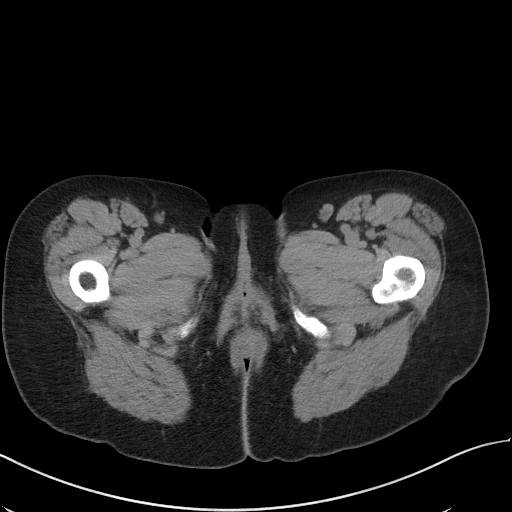
[im 4/85  bone]
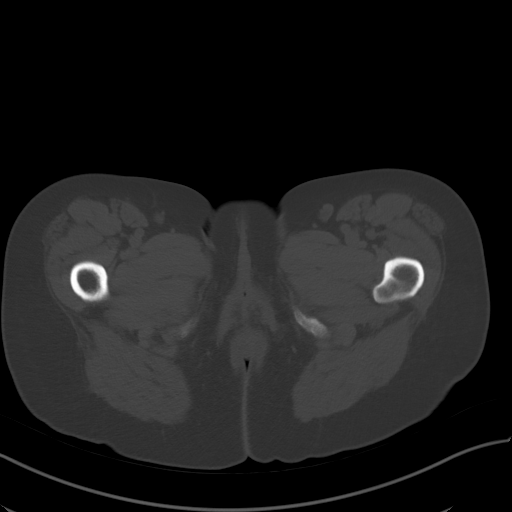
[im 11/85  soft-tissue]
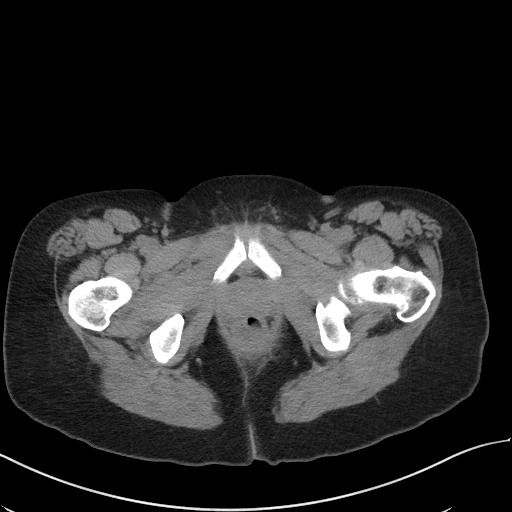
[im 19/85  soft-tissue]
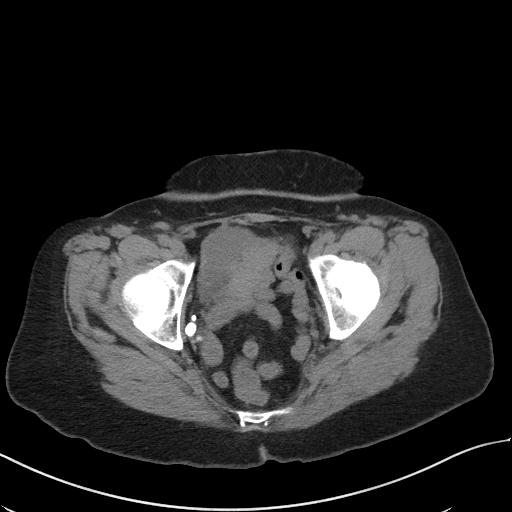
[im 22/85  soft-tissue]
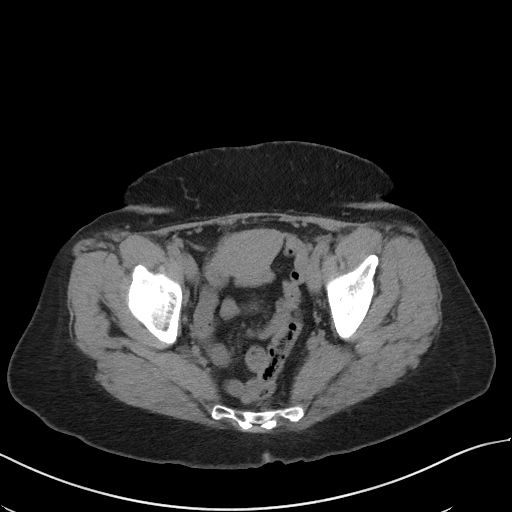
[im 30/85  soft-tissue]
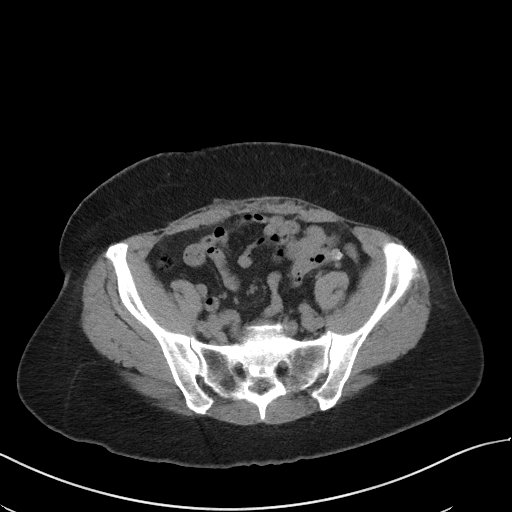
[im 37/85  soft-tissue]
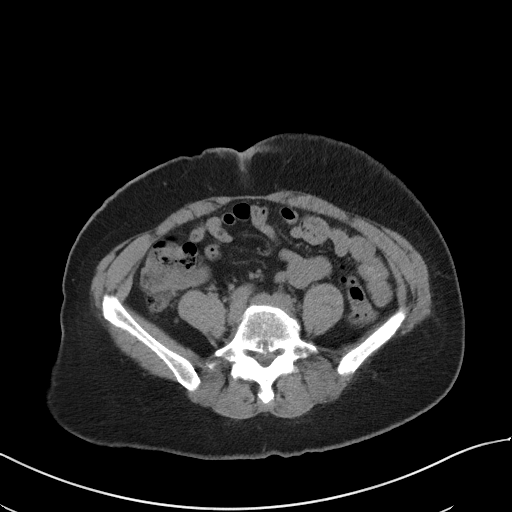
[im 44/85  soft-tissue]
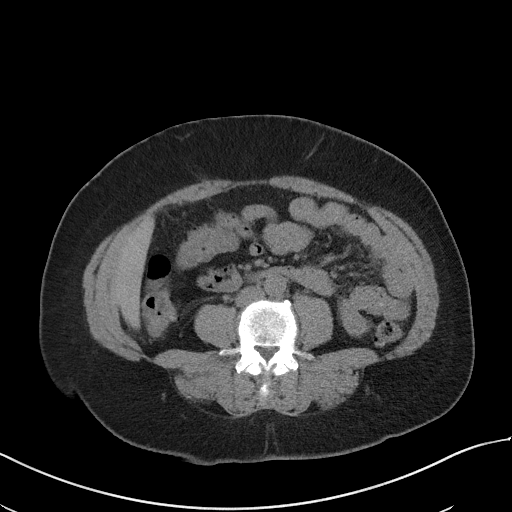
[im 48/85  soft-tissue]
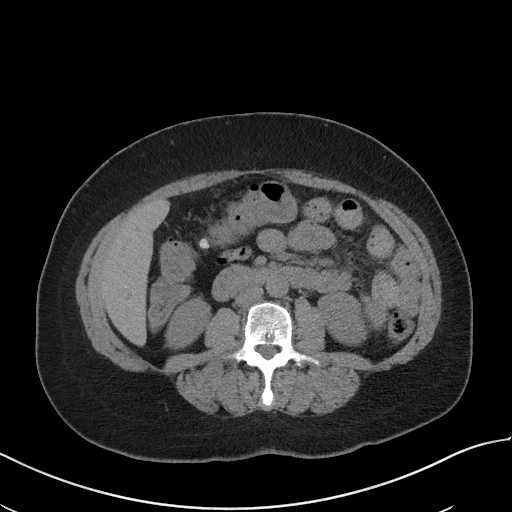
[im 55/85  soft-tissue]
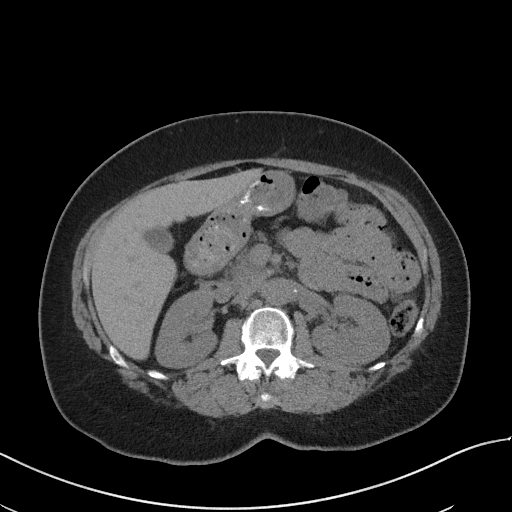
[im 55/85  bone]
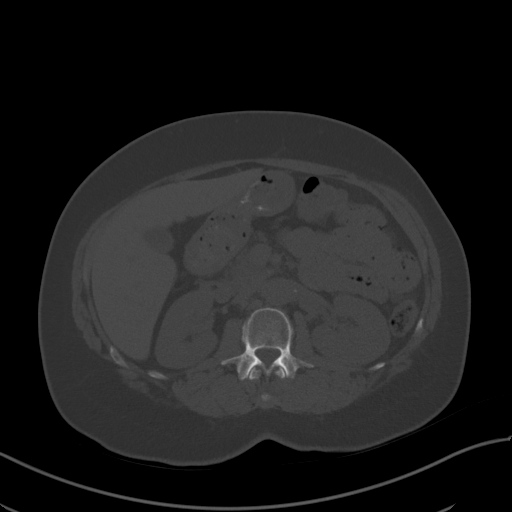
[im 63/85  soft-tissue]
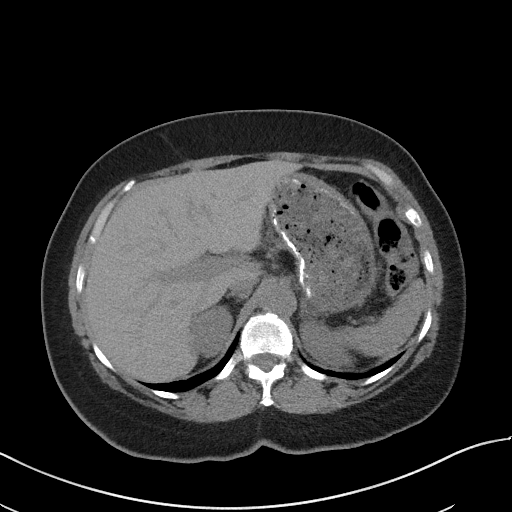
[im 66/85  soft-tissue]
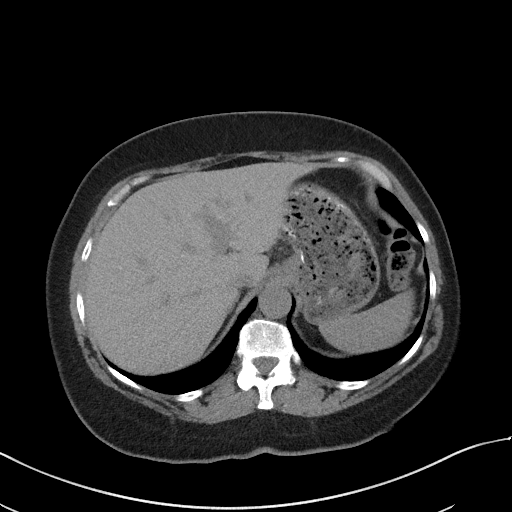
[im 74/85  soft-tissue]
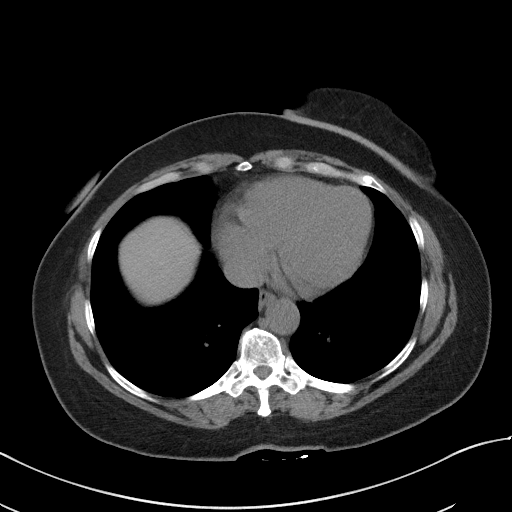
[im 81/85  soft-tissue]
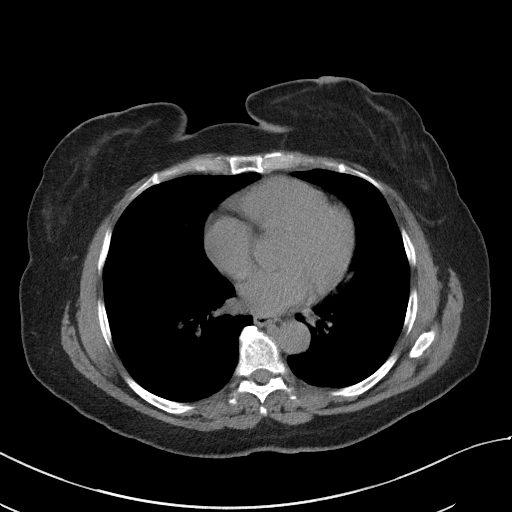

[Series 5: coronal st · coronal · 0.69mm/px · 3 of 98 slices shown]
[im 33/98  soft-tissue]
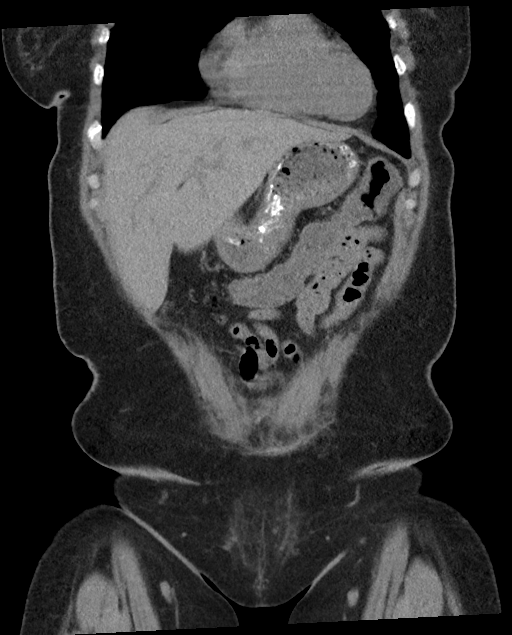
[im 44/98  soft-tissue]
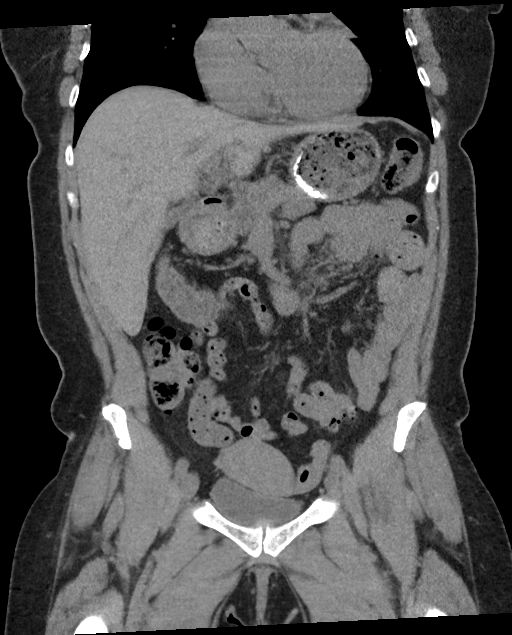
[im 54/98  soft-tissue]
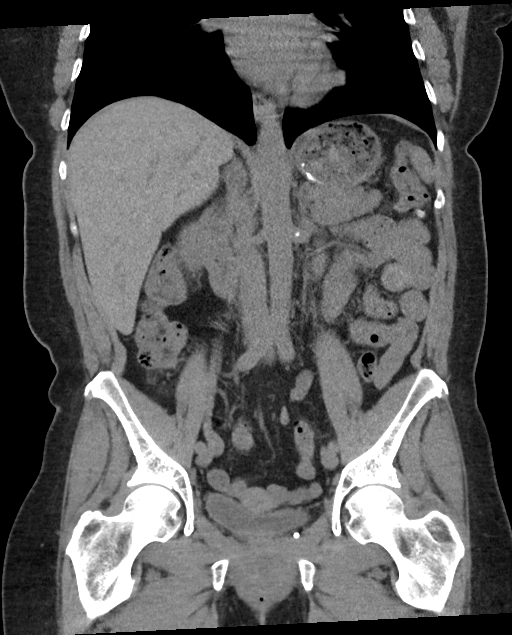

[16 of 46 positions shown; findings below may reference images not displayed]

FINDINGS: Lower chest: Lung bases demonstrate no acute consolidation or
effusion. Coarse calcifications in the left breast. Normal cardiac
size.

Hepatobiliary: No focal liver abnormality is seen. No gallstones,
gallbladder wall thickening, or biliary dilatation.

Pancreas: Unremarkable. No pancreatic ductal dilatation or
surrounding inflammatory changes.

Spleen: Normal in size without focal abnormality.

Adrenals/Urinary Tract: Adrenal glands are unremarkable. Kidneys are
normal, without renal calculi, focal lesion, or hydronephrosis.
Bladder is unremarkable.

Stomach/Bowel: Radiodense material in the stomach. No dilated small
bowel. Diffuse diverticular disease of the colon without definitive
wall thickening or inflammation. Negative appendix.

Vascular/Lymphatic: No significant vascular findings are present. No
enlarged abdominal or pelvic lymph nodes.

Reproductive: Uterus and bilateral adnexa are unremarkable.

Other: Negative for free air or free fluid.

Musculoskeletal: No acute or significant osseous findings.
IMPRESSION: 1. No CT evidence for acute intra-abdominal or pelvic abnormality.
2. Diverticular disease of the colon without acute inflammatory
process

## 2022-12-09 ENCOUNTER — Other Ambulatory Visit: Payer: Self-pay | Admitting: Physician Assistant

## 2022-12-09 DIAGNOSIS — M62838 Other muscle spasm: Secondary | ICD-10-CM

## 2022-12-09 DIAGNOSIS — R110 Nausea: Secondary | ICD-10-CM

## 2022-12-09 NOTE — Telephone Encounter (Unsigned)
Copied from CRM [id number]. Topic: General - Other >> Dec 09, 2022  2:20 PM Everette C wrote: Reason for CRM: Medication Refill - Medication: ondansetron (ZOFRAN ODT) 4 MG disintegrating tablet [[id number]]  Has the patient contacted their pharmacy? Yes.   (Agent: If no, request that the patient contact the pharmacy for the refill. If patient does not wish to contact the pharmacy document the reason why and proceed with request.) (Agent: If yes, when and what did the pharmacy advise?)  Preferred Pharmacy (with phone number or street name): [id number] [place], [place] - [address] [place] [address] Phone: [phone] Fax: [phone] Hours: Not open 24 hours   Has the patient been seen for an appointment in the last year OR does the patient have an upcoming appointment? Yes.    Agent: Please be advised that RX refills may take up to 3 business days. We ask that you follow-up with your pharmacy.

## 2022-12-10 NOTE — Telephone Encounter (Signed)
Requested medication (s) are due for refill today: yes  Requested medication (s) are on the active medication list: yes  Last refill:  08/26/22  Future visit scheduled: no  Notes to clinic:  Unable to refill per protocol, cannot delegate.      Requested Prescriptions  Pending Prescriptions Disp Refills   cyclobenzaprine (FLEXERIL) 10 MG tablet [Pharmacy Med Name: CYCLOBENZAPRINE 10 MG TABLET] 90 tablet 1    Sig: TAKE 1 TABLET BY MOUTH EVERYDAY AT BEDTIME     Not Delegated - Analgesics:  Muscle Relaxants Failed - 12/09/2022  2:17 PM      Failed - This refill cannot be delegated      Passed - Valid encounter within last 6 months    Recent Outpatient Visits           3 months ago Hypotension, unspecified hypotension type   [facility] Primary Care & [facility] Medicine at [facility], [place], [place]   3 months ago Rectal bleeding   [facility] at [facility], [place], [place]   5 months ago Rectal bleeding   [facility] Sports Medicine at [facility], [place], [place]

## 2022-12-10 NOTE — Telephone Encounter (Signed)
Requested medications are due for refill today.  yes  Requested medications are on the active medications list.  yes  Last refill. 08/26/2022 #30 1 rf  Future visit scheduled.   no  Notes to clinic.  Refill not delegated.    Requested Prescriptions  Pending Prescriptions Disp Refills   ondansetron (ZOFRAN ODT) 4 MG disintegrating tablet 30 tablet 1    Sig: Allow 1-2 tablets to dissolve in your mouth every 8 hours as needed for nausea/vomiting     Not Delegated - Gastroenterology: Antiemetics - ondansetron Failed - 12/09/2022  2:45 PM      Failed - This refill cannot be delegated      Passed - AST in normal range and within 360 days    AST  Date Value Ref Range Status  08/17/2022 23 15 - 41 U/L Final   SGOT(AST)  Date Value Ref Range Status  04/13/2014 32 15 - 37 Unit/L Final         Passed - ALT in normal range and within 360 days    ALT  Date Value Ref Range Status  08/17/2022 21 0 - 44 U/L Final   SGPT (ALT)  Date Value Ref Range Status  04/13/2014 36 14 - 63 U/L Final         Passed - Valid encounter within last 6 months    Recent Outpatient Visits           3 months ago Hypotension, unspecified hypotension type   [facility] at [facility], [place], [place]   3 months ago Rectal bleeding   [facility] Primary Care & Sports Medicine at [facility], [place], [place]   5 months ago Rectal bleeding   [facility] Primary Care & Sports Medicine at [facility], [place], [place]

## 2022-12-11 MED ORDER — ONDANSETRON 4 MG PO TBDP: ORAL_TABLET | ORAL | 0 refills | Status: DC

## 2022-12-16 ENCOUNTER — Ambulatory Visit: Payer: BC Managed Care – PPO | Admitting: Physician Assistant

## 2022-12-22 ENCOUNTER — Other Ambulatory Visit: Payer: Self-pay | Admitting: Physician Assistant

## 2022-12-22 DIAGNOSIS — E785 Hyperlipidemia, unspecified: Secondary | ICD-10-CM

## 2022-12-22 DIAGNOSIS — K219 Gastro-esophageal reflux disease without esophagitis: Secondary | ICD-10-CM

## 2023-01-23 ENCOUNTER — Other Ambulatory Visit: Payer: Self-pay | Admitting: Physician Assistant

## 2023-01-23 DIAGNOSIS — K581 Irritable bowel syndrome with constipation: Secondary | ICD-10-CM

## 2023-01-23 NOTE — Telephone Encounter (Signed)
Medication Refill -  Most Recent Primary Care Visit:  Provider: Remo Lipps  Department: [facility]  Visit Type: OFFICE VISIT  Date: 08/26/2022  Medication: LINZESS 145 MCG CAPS capsule   Has the patient contacted their pharmacy? Yes (Agent: If no, request that the patient contact the pharmacy for the refill. If patient does not wish to contact the pharmacy document the reason why and proceed with request.) (Agent: If yes, when and what did the pharmacy advise?)  Is this the correct pharmacy for this prescription? Yes If no, delete pharmacy and type the correct one.  This is the patient's preferred pharmacy:  [id number] - [place], [place] - [address] [place] [address] Phone: [phone] Fax: [phone]   Has the prescription been filled recently? Yes  Is the patient out of the medication? No  Has the patient been seen for an appointment in the last year OR does the patient have an upcoming appointment? Yes  Can we respond through MyChart? Yes  Agent: Please be advised that Rx refills may take up to 3 business days. We ask that you follow-up with your pharmacy.

## 2023-01-24 MED ORDER — LINZESS 145 MCG PO CAPS: 145.0000 ug | ORAL_CAPSULE | Freq: Every day | ORAL | 0 refills | Status: DC

## 2023-01-24 NOTE — Telephone Encounter (Signed)
Requested Prescriptions  Pending Prescriptions Disp Refills   LINZESS 145 MCG CAPS capsule 90 capsule 0    Sig: Take 1 capsule (145 mcg total) by mouth daily.     Gastroenterology: Irritable Bowel Syndrome Passed - 01/23/2023  4:10 PM      Passed - Valid encounter within last 12 months    Recent Outpatient Visits           5 months ago Hypotension, unspecified hypotension type   [facility] at [facility], [place], [place]   5 months ago Rectal bleeding   [facility] & [facility] Medicine at [facility], [place], [place]   7 months ago Rectal bleeding   [facility] Primary Care & Sports Medicine at [facility], [place], [place]

## 2023-03-25 ENCOUNTER — Encounter: Payer: Self-pay | Admitting: Physician Assistant

## 2023-03-25 ENCOUNTER — Ambulatory Visit: Payer: BC Managed Care – PPO | Admitting: Internal Medicine

## 2023-03-25 ENCOUNTER — Ambulatory Visit: Payer: Self-pay | Admitting: *Deleted

## 2023-03-25 ENCOUNTER — Ambulatory Visit: Payer: Self-pay

## 2023-03-25 ENCOUNTER — Ambulatory Visit: Payer: BC Managed Care – PPO | Admitting: Physician Assistant

## 2023-03-25 ENCOUNTER — Emergency Department
Admission: EM | Admit: 2023-03-25 | Discharge: 2023-03-25 | Discharge status: Against Medical Advice | Payer: BC Managed Care – PPO | Attending: Emergency Medicine | Admitting: Emergency Medicine

## 2023-03-25 ENCOUNTER — Other Ambulatory Visit: Payer: Self-pay

## 2023-03-25 VITALS — BP 126/82 | HR 86 | Ht 60.0 in | Wt 154.0 lb

## 2023-03-25 DIAGNOSIS — Z5321 Procedure and treatment not carried out due to patient leaving prior to being seen by health care provider: Secondary | ICD-10-CM | POA: Diagnosis not present

## 2023-03-25 DIAGNOSIS — I10 Essential (primary) hypertension: Secondary | ICD-10-CM | POA: Insufficient documentation

## 2023-03-25 DIAGNOSIS — R519 Headache, unspecified: Secondary | ICD-10-CM

## 2023-03-25 LAB — COMPREHENSIVE METABOLIC PANEL
ALT: 23 U/L (ref 0–44)
AST: 25 U/L (ref 15–41)
Albumin: 4.3 g/dL (ref 3.5–5.0)
Alkaline Phosphatase: 49 U/L (ref 38–126)
Anion gap: 10 (ref 5–15)
BUN: 14 mg/dL (ref 8–23)
CO2: 25 mmol/L (ref 22–32)
Calcium: 9 mg/dL (ref 8.9–10.3)
Chloride: 100 mmol/L (ref 98–111)
Creatinine, Ser: 0.99 mg/dL (ref 0.44–1.00)
GFR, Estimated: >60 mL/min (ref >60)
Glucose, Bld: 96 mg/dL (ref 70–99)
Potassium: 3.5 mmol/L (ref 3.5–5.1)
Sodium: 135 mmol/L (ref 135–145)
Total Bilirubin: 0.6 mg/dL (ref 0.0–1.2)
Total Protein: 8.2 g/dL — ABNORMAL HIGH (ref 6.5–8.1)

## 2023-03-25 LAB — CBC WITH DIFFERENTIAL/PLATELET
Abs Immature Granulocytes: 0 10*3/uL (ref 0.00–0.07)
Basophils Absolute: 0.1 10*3/uL (ref 0.0–0.1)
Basophils Relative: 1 %
Eosinophils Absolute: 0.1 10*3/uL (ref 0.0–0.5)
Eosinophils Relative: 1 %
HCT: 41.3 % (ref 36.0–46.0)
Hemoglobin: 12.6 g/dL (ref 12.0–15.0)
Immature Granulocytes: 0 %
Lymphocytes Relative: 30 %
Lymphs Abs: 1.3 10*3/uL (ref 0.7–4.0)
MCH: 28 pg (ref 26.0–34.0)
MCHC: 30.5 g/dL (ref 30.0–36.0)
MCV: 91.8 fL (ref 80.0–100.0)
Monocytes Absolute: 0.3 10*3/uL (ref 0.1–1.0)
Monocytes Relative: 8 %
Neutro Abs: 2.5 10*3/uL (ref 1.7–7.7)
Neutrophils Relative %: 60 %
Platelets: 317 10*3/uL (ref 150–400)
RBC: 4.5 MIL/uL (ref 3.87–5.11)
RDW: 14.5 % (ref 11.5–15.5)
WBC: 4.2 10*3/uL (ref 4.0–10.5)
nRBC: 0 % (ref 0.0–0.2)

## 2023-03-25 NOTE — Telephone Encounter (Signed)
  Chief Complaint: Patient states she went to ED- they were very busy- she was informed by nurse that her BP had come down and her wait was going to be exteneded- patient left and is calling to see if she can come to office.  Symptoms: headache, neck pain Frequency: today- patient has left the ED due to BP level decreasing and nursing staff advising her of long wait.  Pertinent Negatives: Patient denies chest pain, SOB Disposition: [] ED /[] Urgent Care (no appt availability in office) / [x] Appointment(In office/virtual)/ []  [facility]/ [] Home Care/ [] Refused Recommended Disposition /[] [facility] [facility]/ []  Follow-up with PCP Additional Notes: Patient may need BP medication adjustment- she has left hospital due to her BP decreasing- appointment scheduled.

## 2023-03-25 NOTE — ED Triage Notes (Signed)
 Patient comes in from home via pov with complaints of hypertension. Pt states that when she got to work this morning around 1am, her blood pressure was 168/103. Pt complains of a headache 7/10. Pt is alert and oriented x10 with no signs of acute distress at this time.

## 2023-03-25 NOTE — Telephone Encounter (Signed)
     Chief Complaint: Exposed to smoke at work yesterday and BP up this morning 168/102 and has come down to 155/98. Has a headache. Request appointment as early as possible. Symptoms: Above Frequency: Today Pertinent Negatives: Patient denies any other symptoms. Disposition: [] ED /[] Urgent Care (no appt availability in office) / [x] Appointment(In office/virtual)/ []  [facility]/ [] Home Care/ [] Refused Recommended Disposition /[] [facility] [facility]/ []  Follow-up with PCP Additional Notes: Agrees with appointment. Will go to ED for worsening of symptoms.  Reason for Disposition  Systolic BP  >= 180 OR Diastolic >= 110  Answer Assessment - Initial Assessment Questions 1. BLOOD PRESSURE: What is the blood pressure? Did you take at least two measurements 5 minutes apart?     168/102    155/98 2. ONSET: When did you take your blood pressure?     Today 3. HOW: How did you take your blood pressure? (e.g., automatic home BP monitor, visiting nurse)     Home cuff 4. HISTORY: Do you have a history of high blood pressure?     Yes 5. MEDICINES: Are you taking any medicines for blood pressure? Have you missed any doses recently?     Yes 6. OTHER SYMPTOMS: Do you have any symptoms? (e.g., blurred vision, chest pain, difficulty breathing, headache, weakness)     Headache 7. PREGNANCY: Is there any chance you are pregnant? When was your last menstrual period?     No  Protocols used: Blood Pressure - High-A-AH

## 2023-03-25 NOTE — Progress Notes (Signed)
 Date:  03/25/2023   Name:  Shelley Flowers   DOB:  Jan 09, 1961   MRN:  [id number]   Chief Complaint: Hypertension (Started this morning around 1am. Patient took BP with arm cuff at home and it was 168/103. Hypertension and headache per patient. )  HPI Shelley Flowers presents today for evaluation of recently increased blood pressure and headache for the last 2 days.  She endorses imperfect compliance with her blood pressure medicine olmesartan  HCTZ despite our prior conversations about this.  She states she only takes it as needed.  Blood pressure was 120s systolic before she left for her appointment today.  The headache is described as a throbbing sensation across the frontal scalp, not localized to any particular area, waxes and wanes throughout the day.  She took Excedrin Migraine which took the edge off.  Sleep is poor and variable, as she works both 3rd shift and 1st shift.   Medication list has been reviewed and updated.  Current Meds  Medication Sig   aspirin-acetaminophen -caffeine (EXCEDRIN MIGRAINE) 250-250-65 MG tablet Take 2 tablets by mouth daily as needed for headache.   cyclobenzaprine  (FLEXERIL ) 10 MG tablet Take 1 tablet (10 mg total) by mouth at bedtime.   LINZESS  145 MCG CAPS capsule Take 1 capsule (145 mcg total) by mouth daily.   olmesartan -hydrochlorothiazide (BENICAR  HCT) 40-25 MG tablet Take 1 tablet by mouth daily.   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  Past Surgical History:  Procedure Laterality Date   CESAREAN SECTION      Social History   Tobacco Use   Smoking status: Never   Smokeless tobacco: Never  Substance Use Topics   Alcohol use: No   Drug use: No    History reviewed. No pertinent family history.      03/25/2023    3:39 PM 08/26/2022    1:30 PM 08/19/2022    1:33 PM 06/24/2022    9:36 AM  GAD 7 : Generalized Anxiety Score  Nervous, Anxious, on Edge 0 0 0 0  Control/stop worrying 0 0 0 1  Worry too much - different things 0 0 2 1  Trouble relaxing 0 0 3 3  Restless 0 0 3 3  Easily annoyed or irritable 0 0 3 3  Afraid - awful might happen 0 0 0 0  Total GAD 7 Score 0 0 11 11  Anxiety Difficulty Not difficult at all Not difficult at all Somewhat difficult        03/25/2023    3:39 PM 08/26/2022    1:30 PM 08/19/2022    1:33 PM  Depression screen PHQ 2/9  Decreased Interest 0 0 1  Down, Depressed, Hopeless 0 0 0  PHQ - 2 Score 0 0 1  Altered sleeping  2 3  Tired, decreased energy  0 3  Change in  appetite  0 0  Feeling bad or failure about yourself   0 0  Trouble concentrating  0 0  Moving slowly or fidgety/restless  0 0  Suicidal thoughts  0 0  PHQ-9 Score  2 7  Difficult doing work/chores  Not difficult at all Not difficult at all    BP Readings from Last 3 Encounters:  03/25/23 126/82  03/25/23 (!) 143/93  08/26/22 128/78    Wt Readings from Last 3 Encounters:  03/25/23 154 lb (69.9 kg)  03/25/23 140 lb (63.5 kg)  08/26/22 154 lb (69.9 kg)    BP 126/82 (BP Location: Left Arm, Patient Position: Sitting, Cuff Size: Large)   Pulse 86   Ht 5' (1.524 m)   Wt 154 lb (69.9 kg)   LMP 12/12/2010   SpO2 97%   BMI 30.08 kg/m   Physical Exam Vitals and nursing note reviewed.  Constitutional:      Appearance: Normal appearance.  HENT:     Right Ear: Tympanic membrane normal.      Left Ear: Tympanic membrane normal.  Eyes:     Pupils: Pupils are equal, round, and reactive to light.  Cardiovascular:     Rate and Rhythm: Normal rate and regular rhythm.     Heart sounds: No murmur heard.    No friction rub. No gallop.  Pulmonary:     Effort: Pulmonary effort is normal.     Breath sounds: Normal breath sounds.  Abdominal:     General: There is no distension.  Musculoskeletal:        General: Normal range of motion.  Skin:    General: Skin is warm and dry.  Neurological:     Mental Status: She is alert and oriented to person, place, and time.     Gait: Gait is intact.  Psychiatric:        Mood and Affect: Mood and affect normal.     Recent Labs     Component Value Date/Time   NA 135 03/25/2023 0903   NA 142 07/01/2022 0847   NA 138 04/13/2014 1612   K 3.5 03/25/2023 0903   K 3.5 04/13/2014 1612   CL 100 03/25/2023 0903   CL 103 04/13/2014 1612   CO2 25 03/25/2023 0903   CO2 29 04/13/2014 1612   GLUCOSE 96 03/25/2023 0903   GLUCOSE 106 (H) 04/13/2014 1612   BUN 14 03/25/2023 0903   BUN 8 07/01/2022 0847   BUN 11 04/13/2014 1612   CREATININE 0.99 03/25/2023 0903   CREATININE 0.73 04/13/2014 1612   CALCIUM  9.0 03/25/2023 0903   CALCIUM  9.4 04/13/2014 1612   PROT 8.2 (H) 03/25/2023 0903   PROT 7.4 07/01/2022 0847   PROT 8.4 (H) 04/13/2014 1612   ALBUMIN 4.3 03/25/2023 0903   ALBUMIN 4.6 07/01/2022 0847   ALBUMIN 3.9 04/13/2014 1612   AST 25 03/25/2023 0903   AST 32 04/13/2014 1612   ALT 23 03/25/2023 0903   ALT 36 04/13/2014 1612   ALKPHOS 49 03/25/2023 0903   ALKPHOS 51 04/13/2014 1612   BILITOT 0.6 03/25/2023 0903   BILITOT 0.5 07/01/2022 0847   BILITOT 0.7 04/13/2014 1612   GFRNONAA >60 03/25/2023 0903   GFRNONAA >60 04/13/2014 1612   GFRNONAA >60 06/19/2013 2146   GFRAA >60 04/19/2019 2123   GFRAA >60 04/13/2014 1612   GFRAA >60 06/19/2013 2146    Lab Results  Component Value Date   WBC 4.2 03/25/2023   HGB 12.6 03/25/2023  HCT 41.3 03/25/2023   MCV 91.8 03/25/2023   PLT 317 03/25/2023   Lab Results  Component Value Date   HGBA1C 5.8 (H) 07/01/2022   Lab Results  Component Value Date   CHOL 180 07/01/2022   HDL 72 07/01/2022   LDLCALC 96 07/01/2022   TRIG 64 07/01/2022   CHOLHDL 2.5 07/01/2022   No results found for: TSH   Assessment and Plan:  1. Primary hypertension (Primary) Blood pressure well-controlled on provider recheck today.  Patient reassured and emphasized of the importance of medication compliance.  I want her to take olmesartan  HCTZ every single day and continue home blood pressure monitoring.  She is to let me know if she has any hypotensive symptoms or any blood pressure readings less than 100 systolic.  2. Acute nonintractable headache, unspecified headache type Probably tension headache.  Likely brought on by poor/variable sleep.  Excedrin Migraine seems to have helped for now.  Previously prescribed cyclobenzaprine , which she likely has some leftover.  No need for further intervention at this time.   Return in about 3 months (around 06/23/2023) for CPE.    Rolan Hoyle, PA-C, DMSc, Nutritionist [facility] Primary Care and Sports Medicine [facility] [phone]

## 2023-03-25 NOTE — Telephone Encounter (Signed)
 Reason for Disposition  [1] Systolic BP  >= 130 OR Diastolic >= 80 AND [2] taking BP medications  Answer Assessment - Initial Assessment Questions 1. BLOOD PRESSURE: What is the blood pressure? Did you take at least two measurements 5 minutes apart?     145/98 - did come down at hospital        168/103- this morning 2. ONSET: When did you take your blood pressure?     ED- now 3. HOW: How did you take your blood pressure? (e.g., automatic home BP monitor, visiting nurse)     Automatic cuff- arm 4. HISTORY: Do you have a history of high blood pressure?     yes 5. MEDICINES: Are you taking any medicines for blood pressure? Have you missed any doses recently?     Yes- no missed medication  6. OTHER SYMPTOMS: Do you have any symptoms? (e.g., blurred vision, chest pain, difficulty breathing, headache, weakness)     Headache, neck pain  Protocols used: Blood Pressure - High-A-AH

## 2023-03-25 NOTE — Patient Instructions (Addendum)
-  It was a pleasure to see you today! Please review your visit summary for helpful information -I would encourage you to follow your care via MyChart where you can access lab results, notes, messages, and more -If you feel that we did a nice job today, please complete your after-visit survey and leave [place] a Google review! Your CMA today was Mariann Barter and your provider was Alvester Morin, PA-C, DMSc -Please return for follow-up in about 3 months for routine physical

## 2023-03-27 ENCOUNTER — Telehealth: Payer: Self-pay

## 2023-03-27 NOTE — Transitions of Care (Post Inpatient/ED Visit) (Signed)
   03/27/2023  Name: Shelley Flowers MRN: [id number] DOB: [birth year]  Pt went to ED. Pt left against medical advice. Pt schedule an appointment the same day and was seen for HTN. No further calls are needed  KP

## 2023-03-30 ENCOUNTER — Other Ambulatory Visit: Payer: Self-pay | Admitting: Physician Assistant

## 2023-03-30 DIAGNOSIS — K219 Gastro-esophageal reflux disease without esophagitis: Secondary | ICD-10-CM

## 2023-03-31 NOTE — Telephone Encounter (Signed)
Requested Prescriptions  Pending Prescriptions Disp Refills   omeprazole (PRILOSEC) 20 MG capsule [Pharmacy Med Name: OMEPRAZOLE DR 20 MG CAPSULE] 90 capsule 0    Sig: TAKE 1 CAPSULE BY MOUTH EVERY DAY     Gastroenterology: Proton Pump Inhibitors Passed - 03/31/2023 12:38 PM      Passed - Valid encounter within last 12 months    Recent Outpatient Visits           6 days ago Primary hypertension   [place] Primary Care & Sports Medicine at [facility], [place], [place]   7 months ago Hypotension, unspecified hypotension type   [facility] Care & Sports Medicine at [facility], [place], [place]   7 months ago Rectal bleeding   [facility] Primary Care & Sports Medicine at [facility], [place], [place]   9 months ago Rectal bleeding   [facility] Primary Care & Sports Medicine at [facility], [place], [place]       Future Appointments             In 3 months Mordecai Maes, [place], [place] [facility] Primary Care & Sports Medicine at [facility], [facility]

## 2023-04-21 ENCOUNTER — Other Ambulatory Visit: Payer: Self-pay | Admitting: Physician Assistant

## 2023-04-21 DIAGNOSIS — E785 Hyperlipidemia, unspecified: Secondary | ICD-10-CM

## 2023-04-21 DIAGNOSIS — K219 Gastro-esophageal reflux disease without esophagitis: Secondary | ICD-10-CM

## 2023-04-21 DIAGNOSIS — M62838 Other muscle spasm: Secondary | ICD-10-CM

## 2023-04-21 DIAGNOSIS — K581 Irritable bowel syndrome with constipation: Secondary | ICD-10-CM

## 2023-04-22 NOTE — Telephone Encounter (Signed)
 Requested Prescriptions  Pending Prescriptions Disp Refills   atorvastatin (LIPITOR) 20 MG tablet [Pharmacy Med Name: ATORVASTATIN 20 MG TABLET] 90 tablet     Sig: TAKE 1 TABLET BY MOUTH EVERY DAY     Cardiovascular:  Antilipid - Statins Failed - 04/22/2023 12:37 PM      Failed - Lipid Panel in normal range within the last 12 months    Cholesterol, Total  Date Value Ref Range Status  07/01/2022 180 100 - 199 mg/dL Final   LDL Chol Calc (NIH)  Date Value Ref Range Status  07/01/2022 96 0 - 99 mg/dL Final   HDL  Date Value Ref Range Status  07/01/2022 72 >39 mg/dL Final   Triglycerides  Date Value Ref Range Status  07/01/2022 64 0 - 149 mg/dL Final         Passed - Patient is not pregnant      Passed - Valid encounter within last 12 months    Recent Outpatient Visits           4 weeks ago Primary hypertension   [place] Primary Care & Sports Medicine at [facility], [place], [place]   7 months ago Hypotension, unspecified hypotension type   [facility] Primary Care & Sports Medicine at [facility], [place], [place]   8 months ago Rectal bleeding   [facility] Primary Care & Sports Medicine at [facility], [place], [place]   10 months ago Rectal bleeding   [facility] Primary Care & Sports Medicine at [facility], [place], [place]       Future Appointments             In 2 months Mordecai Maes, [place], PA [facility] Primary Care & Sports Medicine at [facility], [facility]             LINZESS 145 MCG CAPS capsule [Pharmacy Med Name: LINZESS 145 MCG CAPSULE] 90 capsule     Sig: TAKE 1 CAPSULE BY MOUTH EVERY DAY     Gastroenterology: Irritable Bowel Syndrome Passed - 04/22/2023 12:37 PM      Passed - Valid encounter within last 12 months    Recent Outpatient Visits           4 weeks ago Primary hypertension   [place] Primary Care & Sports Medicine at [facility], [place], [place]   7 months ago Hypotension,  unspecified hypotension type   [facility] Sports Medicine at [facility], [place], [place]   8 months ago Rectal bleeding   [facility] Primary Care & Sports Medicine at [facility], [place], [place]   10 months ago Rectal bleeding   [facility] Primary Care & Sports Medicine at [facility], [place], [place]       Future Appointments             In 2 months Mordecai Maes, [place], PA [facility] Primary Care & Sports Medicine at [facility], [facility]             cyclobenzaprine (FLEXERIL) 10 MG tablet [Pharmacy Med Name: CYCLOBENZAPRINE 10 MG TABLET] 90 tablet     Sig: TAKE 1 TABLET BY MOUTH EVERYDAY AT BEDTIME     Not Delegated - Analgesics:  Muscle Relaxants Failed - 04/22/2023 12:37 PM      Failed - This refill cannot be delegated      Passed - Valid encounter within last 6  months    Recent Outpatient Visits           4 weeks ago Primary hypertension   [place] Primary Care & Sports Medicine at [facility], [place], [place]   7 months ago Hypotension, unspecified hypotension type   [facility] Primary Care & Sports Medicine at [facility], [place], [place]   8 months ago Rectal bleeding   [facility] Primary Care & Sports Medicine at [facility], [place], [place]   10 months ago Rectal bleeding   [facility] Primary Care & Sports Medicine at [facility], [place], [place]       Future Appointments             In 2 months Mordecai Maes, [place], PA [facility] Primary Care & Sports Medicine at [facility], [facility]             omeprazole (PRILOSEC) 20 MG capsule [Pharmacy Med Name: OMEPRAZOLE DR 20 MG CAPSULE] 90 capsule     Sig: TAKE 1 CAPSULE BY MOUTH EVERY DAY     Gastroenterology: Proton Pump Inhibitors Passed - 04/22/2023 12:37 PM      Passed - Valid encounter within last 12 months    Recent Outpatient Visits           4 weeks ago Primary hypertension   [place] Primary Care &  Sports Medicine at [facility], [place], [place]   7 months ago Hypotension, unspecified hypotension type   [facility] Primary Care & Sports Medicine at [facility], [place], [place]   8 months ago Rectal bleeding   [facility] Primary Care & Sports Medicine at [facility], [place], [place]   10 months ago Rectal bleeding   [facility] Primary Care & Sports Medicine at [facility], [place], [place]       Future Appointments             In 2 months Mordecai Maes, [place], [place] [facility] Primary Care & Sports Medicine at [facility], [facility]

## 2023-04-22 NOTE — Telephone Encounter (Signed)
Requested medication (s) are due for refill today: yes except Linzess end date for the Linzess 04/24/23  Requested medication (s) are on the active medication list: yes  Last refill:  cyclobenzaprine: 08/26/22 #90 1 RF      Future visit scheduled: yes  Notes to clinic:  cyclobenzaprine: RF not delegated   Requested Prescriptions  Pending Prescriptions Disp Refills   LINZESS 145 MCG CAPS capsule [Pharmacy Med Name: LINZESS 145 MCG CAPSULE] 90 capsule     Sig: TAKE 1 CAPSULE BY MOUTH EVERY DAY     Gastroenterology: Irritable Bowel Syndrome Passed - 04/22/2023 12:43 PM      Passed - Valid encounter within last 12 months    Recent Outpatient Visits           4 weeks ago Primary hypertension   [place] [facility] at [facility], [place]   7 months ago Hypotension, unspecified hypotension type   [facility] at [facility], [place], [place]   8 months ago Rectal bleeding   [facility] at [facility], [place], [place]   10 months ago Rectal bleeding   [facility] at [facility], [place], [place]       Future Appointments             In 2 months Mordecai Maes, [place], [place] [facility] at [facility]             cyclobenzaprine (FLEXERIL) 10 MG tablet [Pharmacy Med Name: CYCLOBENZAPRINE 10 MG TABLET] 90 tablet     Sig: TAKE 1 TABLET BY MOUTH EVERYDAY AT BEDTIME     Not Delegated - Analgesics:  Muscle Relaxants Failed - 04/22/2023 12:43 PM      Failed - This refill cannot be delegated      Passed - Valid encounter within last 6 months    Recent Outpatient Visits           4 weeks ago Primary hypertension   [place] [facility] at [facility], [place], [place]   7 months ago Hypotension, unspecified hypotension type   [facility] at [facility], [place], [place]   8 months ago Rectal bleeding   [facility] at [facility], [place], [place]   10 months ago Rectal bleeding   [facility] at [facility], [place], [place]       Future Appointments             In 2 months Mordecai Maes, [place], [place] [facility] at [facility], [facility]            Refused Prescriptions Disp Refills   atorvastatin (LIPITOR) 20 MG tablet [Pharmacy Med Name: ATORVASTATIN 20 MG TABLET] 90 tablet     Sig: TAKE 1 TABLET BY MOUTH EVERY DAY     Cardiovascular:  Antilipid - Statins Failed - 04/22/2023 12:43 PM      Failed - Lipid Panel in normal range within the last 12 months    Cholesterol, Total  Date Value Ref Range Status  07/01/2022 180 100 - 199 mg/dL Final   LDL Chol Calc (NIH)  Date Value Ref Range Status  07/01/2022 96 0 -  99 mg/dL Final   HDL  Date Value Ref Range Status  07/01/2022 72 >39 mg/dL Final   Triglycerides  Date Value Ref Range Status  07/01/2022 64 0 - 149 mg/dL Final         Passed - Patient is not pregnant      Passed - Valid encounter within last 12 months    Recent Outpatient Visits           4 weeks ago Primary hypertension   [place] [facility] at [facility], [place], [place]   7 months ago Hypotension, unspecified hypotension type   [facility] at [facility], [place], [place]   8 months ago Rectal bleeding   [facility] at [facility], [place], [place]   10 months ago Rectal bleeding   [facility] Medicine at [facility], [place], [place]       Future Appointments             In 2 months Mordecai Maes, [place], [place] [facility] & [facility] Medicine at [facility]             omeprazole (PRILOSEC) 20 MG  capsule [Pharmacy Med Name: OMEPRAZOLE DR 20 MG CAPSULE] 90 capsule     Sig: TAKE 1 CAPSULE BY MOUTH EVERY DAY     Gastroenterology: Proton Pump Inhibitors Passed - 04/22/2023 12:43 PM      Passed - Valid encounter within last 12 months    Recent Outpatient Visits           4 weeks ago Primary hypertension   [place] [facility] at [facility], [place], [place]   7 months ago Hypotension, unspecified hypotension type   [facility] at [facility], [place]   8 months ago Rectal bleeding   [facility] Medicine at [facility], [place], [place]   10 months ago Rectal bleeding   [facility] Primary Care & Sports Medicine at [facility], [place], [place]       Future Appointments             In 2 months Mordecai Maes, [place], [place] [facility] Care & Sports Medicine at [facility], [facility]

## 2023-06-02 ENCOUNTER — Other Ambulatory Visit: Payer: Self-pay | Admitting: Physician Assistant

## 2023-06-02 DIAGNOSIS — M62838 Other muscle spasm: Secondary | ICD-10-CM

## 2023-06-02 MED ORDER — CYCLOBENZAPRINE HCL 10 MG PO TABS: 10.0000 mg | ORAL_TABLET | Freq: Three times a day (TID) | ORAL | 0 refills | Status: AC | PRN

## 2023-06-02 NOTE — Telephone Encounter (Signed)
 Resent

## 2023-06-23 ENCOUNTER — Ambulatory Visit: Payer: BC Managed Care – PPO | Admitting: Physician Assistant

## 2023-06-30 ENCOUNTER — Encounter: Payer: Self-pay | Admitting: Physician Assistant

## 2023-07-01 ENCOUNTER — Encounter: Payer: Self-pay | Admitting: Physician Assistant

## 2023-07-01 ENCOUNTER — Ambulatory Visit: Admitting: Physician Assistant

## 2023-07-01 ENCOUNTER — Other Ambulatory Visit: Payer: Self-pay

## 2023-07-01 ENCOUNTER — Emergency Department
Admission: EM | Admit: 2023-07-01 | Discharge: 2023-07-02 | Disposition: A | Discharge status: Home / Self Care | Attending: Emergency Medicine | Admitting: Emergency Medicine

## 2023-07-01 ENCOUNTER — Emergency Department

## 2023-07-01 VITALS — BP 128/82 | HR 95 | Temp 98.7°F | Ht 60.0 in | Wt 161.0 lb

## 2023-07-01 DIAGNOSIS — I10 Essential (primary) hypertension: Secondary | ICD-10-CM | POA: Diagnosis not present

## 2023-07-01 DIAGNOSIS — K449 Diaphragmatic hernia without obstruction or gangrene: Secondary | ICD-10-CM | POA: Diagnosis not present

## 2023-07-01 DIAGNOSIS — K529 Noninfective gastroenteritis and colitis, unspecified: Secondary | ICD-10-CM

## 2023-07-01 DIAGNOSIS — K760 Fatty (change of) liver, not elsewhere classified: Secondary | ICD-10-CM | POA: Diagnosis not present

## 2023-07-01 DIAGNOSIS — K6289 Other specified diseases of anus and rectum: Secondary | ICD-10-CM | POA: Diagnosis not present

## 2023-07-01 DIAGNOSIS — K5730 Diverticulosis of large intestine without perforation or abscess without bleeding: Secondary | ICD-10-CM | POA: Diagnosis not present

## 2023-07-01 LAB — URINALYSIS, ROUTINE W REFLEX MICROSCOPIC
Bilirubin Urine: NEGATIVE
Glucose, UA: NEGATIVE mg/dL
Hgb urine dipstick: NEGATIVE
Ketones, ur: NEGATIVE mg/dL
Leukocytes,Ua: NEGATIVE
Nitrite: NEGATIVE
Protein, ur: NEGATIVE mg/dL
Specific Gravity, Urine: 1.012 (ref 1.005–1.030)
pH: 6 (ref 5.0–8.0)

## 2023-07-01 LAB — CBC WITH DIFFERENTIAL/PLATELET
Abs Immature Granulocytes: 0.02 10*3/uL (ref 0.00–0.07)
Basophils Absolute: 0 10*3/uL (ref 0.0–0.1)
Basophils Relative: 0 %
Eosinophils Absolute: 0.1 10*3/uL (ref 0.0–0.5)
Eosinophils Relative: 1 %
HCT: 38.2 % (ref 36.0–46.0)
Hemoglobin: 12.1 g/dL (ref 12.0–15.0)
Immature Granulocytes: 0 %
Lymphocytes Relative: 14 %
Lymphs Abs: 1.2 10*3/uL (ref 0.7–4.0)
MCH: 28.6 pg (ref 26.0–34.0)
MCHC: 31.7 g/dL (ref 30.0–36.0)
MCV: 90.3 fL (ref 80.0–100.0)
Monocytes Absolute: 0.8 10*3/uL (ref 0.1–1.0)
Monocytes Relative: 8 %
Neutro Abs: 7 10*3/uL (ref 1.7–7.7)
Neutrophils Relative %: 77 %
Platelets: 230 10*3/uL (ref 150–400)
RBC: 4.23 MIL/uL (ref 3.87–5.11)
RDW: 13.7 % (ref 11.5–15.5)
WBC: 9.1 10*3/uL (ref 4.0–10.5)
nRBC: 0 % (ref 0.0–0.2)

## 2023-07-01 LAB — COMPREHENSIVE METABOLIC PANEL WITH GFR
ALT: 27 U/L (ref 0–44)
AST: 28 U/L (ref 15–41)
Albumin: 4.3 g/dL (ref 3.5–5.0)
Alkaline Phosphatase: 47 U/L (ref 38–126)
Anion gap: 10 (ref 5–15)
BUN: 12 mg/dL (ref 8–23)
CO2: 25 mmol/L (ref 22–32)
Calcium: 9.7 mg/dL (ref 8.9–10.3)
Chloride: 101 mmol/L (ref 98–111)
Creatinine, Ser: 0.63 mg/dL (ref 0.44–1.00)
GFR, Estimated: >60 mL/min (ref >60)
Glucose, Bld: 101 mg/dL — ABNORMAL HIGH (ref 70–99)
Potassium: 3.7 mmol/L (ref 3.5–5.1)
Sodium: 136 mmol/L (ref 135–145)
Total Bilirubin: 1.1 mg/dL (ref 0.0–1.2)
Total Protein: 7.9 g/dL (ref 6.5–8.1)

## 2023-07-01 LAB — LIPASE, BLOOD: Lipase: 29 U/L (ref 11–51)

## 2023-07-01 LAB — TROPONIN I (HIGH SENSITIVITY)
Troponin I (High Sensitivity): 3 ng/L (ref <18)
Troponin I (High Sensitivity): <2 ng/L (ref <18)

## 2023-07-01 MED ORDER — IOHEXOL 300 MG/ML  SOLN
100.0000 mL | Freq: Once | INTRAMUSCULAR | Status: AC | PRN
  Administered 2023-07-01: 100 mL via INTRAVENOUS

## 2023-07-01 MED ORDER — PIPERACILLIN-TAZOBACTAM 3.375 G IVPB 30 MIN
3.3750 g | Freq: Once | INTRAVENOUS | Status: AC
  Administered 2023-07-01: 3.375 g via INTRAVENOUS
  Filled 2023-07-01 (×2): qty 50

## 2023-07-01 NOTE — ED Provider Notes (Signed)
-----------------------------------------   11:20 PM on 07/01/2023 -----------------------------------------   CT abd/pelvis:  1. Marked severity sigmoid colitis and/or diverticulitis. Given the  degree of colonic wall thickening, correlation with GI consult and  subsequent colonoscopy is recommended to further exclude the  presence of an underlying neoplastic process.  2. Small hiatal hernia.  3. Hepatic steatosis.   Assumed care of patient who is awaiting repeat troponin.  CT demonstrates colitis/diverticulitis.  Will administer IV Zosyn .   Jhoselin Crume J, MD 07/02/23 [phone]

## 2023-07-01 NOTE — ED Triage Notes (Signed)
 Pt to ED via POV c/o rectal pain/pressure. Pt reports rectal pressure started yesterday. Pt was seen at PCP for same, rectal exam was performed and nothing was found other than hemorrhoids. PCP told pt to buy stool softener. Denies abd pain

## 2023-07-01 NOTE — Progress Notes (Signed)
 Date:  07/01/2023   Name:  Shelley Flowers   DOB:  1960/10/14   MRN:  [id number]   Chief Complaint: Rectal Pain (X2 daysLast had a BM this morning, was watery,pressure in her bowel area, feeling to make a bowel movement but unable to,pressure when walking, tried stool softener did help, still having pressure  )  HPI Shelley Flowers presents to clinic today for evaluation of rectal fullness/pressure for the last 2 days.  She states that she took a "stool softener" (by which she means Linzess  prescribed by prior PCP) for IBS.  She denies any abdominal or rectal pain, just discomfort, as if some gas needs to be released.  Importantly, she was supposed to establish with GI last year after a bout of rectal bleeding, but no showed her appointment and did not follow-up with them.  She has never had a colonoscopy or any other CRC screening to her knowledge.   Medication list has been reviewed and updated.  Current Meds  Medication Sig   aspirin-acetaminophen -caffeine (EXCEDRIN MIGRAINE) 250-250-65 MG tablet Take 2 tablets by mouth daily as needed for headache.   cyclobenzaprine  (FLEXERIL ) 10 MG tablet Take 1 tablet (10 mg total) by mouth 3 (three) times daily as needed for muscle spasms.   LINZESS  145 MCG CAPS capsule TAKE 1 CAPSULE BY MOUTH EVERY DAY   olmesartan -hydrochlorothiazide (BENICAR  HCT) 40-25 MG tablet Take 1 tablet by mouth daily.   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  Past Surgical History:  Procedure Laterality Date   CESAREAN SECTION      Social History   Tobacco Use   Smoking status: Never   Smokeless tobacco: Never  Substance Use Topics   Alcohol use: No   Drug use: No    History reviewed. No pertinent family history.      03/25/2023    3:39 PM 08/26/2022    1:30 PM 08/19/2022    1:33 PM 06/24/2022    9:36 AM  GAD 7 : Generalized Anxiety Score  Nervous, Anxious, on Edge 0 0 0 0  Control/stop worrying 0 0 0 1  Worry too much - different things 0 0 2 1  Trouble relaxing 0 0 3 3  Restless 0 0 3 3  Easily annoyed or irritable 0 0 3 3  Afraid - awful might happen 0 0 0 0  Total GAD 7 Score 0 0 11 11  Anxiety Difficulty Not difficult at all Not difficult at all Somewhat difficult        03/25/2023    3:39 PM 08/26/2022    1:30 PM 08/19/2022    1:33 PM  Depression screen PHQ 2/9  Decreased Interest 0 0 1  Down, Depressed, Hopeless 0 0 0  PHQ - 2 Score 0 0 1  Altered sleeping  2  3  Tired, decreased energy  0 3  Change in appetite  0 0  Feeling bad or failure about yourself   0 0  Trouble concentrating  0 0  Moving slowly or fidgety/restless  0 0  Suicidal thoughts  0 0  PHQ-9 Score  2 7  Difficult doing work/chores  Not difficult at all Not difficult at all    BP Readings from Last 3 Encounters:  07/01/23 128/82  03/25/23 126/82  03/25/23 (!) 143/93    Wt Readings from Last 3 Encounters:  07/01/23 161 lb (73 kg)  03/25/23 154 lb (69.9 kg)  03/25/23 140 lb (63.5 kg)    BP 128/82   Pulse 95   Temp 98.7 F (37.1 C)   Ht 5' (1.524 m)   Wt 161 lb (73 kg)   LMP 12/12/2010   SpO2 97%   BMI 31.44 kg/m   Physical Exam Vitals and nursing note reviewed.  Constitutional:      Appearance: Normal appearance.  Cardiovascular:     Rate and Rhythm: Normal rate.  Pulmonary:      Effort: Pulmonary effort is normal.  Abdominal:     General: There is no distension.  Genitourinary:    Rectum: Normal. Guaiac result negative. No mass, tenderness, anal fissure or external hemorrhoid.  Musculoskeletal:        General: Normal range of motion.  Skin:    General: Skin is warm and dry.  Neurological:     Mental Status: She is alert and oriented to person, place, and time.     Gait: Gait is intact.  Psychiatric:        Mood and Affect: Mood and affect normal.     Recent Labs     Component Value Date/Time   NA 135 03/25/2023 0903   NA 142 07/01/2022 0847   NA 138 04/13/2014 1612   K 3.5 03/25/2023 0903   K 3.5 04/13/2014 1612   CL 100 03/25/2023 0903   CL 103 04/13/2014 1612   CO2 25 03/25/2023 0903   CO2 29 04/13/2014 1612   GLUCOSE 96 03/25/2023 0903   GLUCOSE 106 (H) 04/13/2014 1612   BUN 14 03/25/2023 0903   BUN 8 07/01/2022 0847   BUN 11 04/13/2014 1612   CREATININE 0.99 03/25/2023 0903   CREATININE 0.73 04/13/2014 1612   CALCIUM  9.0 03/25/2023 0903   CALCIUM  9.4 04/13/2014 1612   PROT 8.2 (H) 03/25/2023 0903   PROT 7.4 07/01/2022 0847   PROT 8.4 (H) 04/13/2014 1612   ALBUMIN 4.3 03/25/2023 0903   ALBUMIN 4.6 07/01/2022 0847   ALBUMIN 3.9 04/13/2014 1612   AST 25 03/25/2023 0903   AST 32 04/13/2014 1612   ALT 23 03/25/2023 0903   ALT 36 04/13/2014 1612   ALKPHOS 49 03/25/2023 0903   ALKPHOS 51 04/13/2014 1612   BILITOT 0.6 03/25/2023 0903   BILITOT 0.5 07/01/2022 0847   BILITOT 0.7 04/13/2014 1612   GFRNONAA >60 03/25/2023 0903   GFRNONAA >60 04/13/2014 1612   GFRNONAA >60 06/19/2013 2146   GFRAA >60 04/19/2019 2123   GFRAA >60 04/13/2014 1612   GFRAA >60 06/19/2013 2146    Lab Results  Component Value Date   WBC 4.2 03/25/2023   HGB 12.6 03/25/2023   HCT 41.3 03/25/2023   MCV 91.8 03/25/2023   PLT 317 03/25/2023   Lab Results  Component Value Date   HGBA1C 5.8 (H) 07/01/2022   Lab Results  Component Value Date   CHOL  180  07/01/2022   HDL 72 07/01/2022   LDLCALC 96 07/01/2022   TRIG 64 07/01/2022   CHOLHDL 2.5 07/01/2022   No results found for: "TSH"   Assessment and Plan:  1. Rectal discomfort (Primary) Encouraged patient to try OTC docusate or other stool softener to promote a more complete bowel movement.  DRE reassuring today with a negative Hemoccult.  That said, emphasized the importance of colonoscopy with the patient as she is long overdue.  She has an existing referral with [facility], so I have provided her with the contact number to reach out to them and try to schedule.  She will let me know if the referral needs to go to a different location.   Follow-up as scheduled for routine physical exam in about a week.   Cody Das, PA-C, DMSc, Nutritionist [facility] Primary Care and Sports [facility] [phone]

## 2023-07-01 NOTE — ED Provider Notes (Signed)
 [facility] Provider Note    Event Date/Time   First MD Initiated Contact with Patient 07/01/23 2055     (approximate)   History   Rectal Pain   HPI  Shelley Flowers is a 63 y.o. female with history of hypertension who comes in with concerns for rectal pressure.  Patient reports that she has had rectal pressure since yesterday.  She states that she saw her primary care doctor this morning and they told her to take some stool softeners which she has not done yet but the pain got a lot worse and then developed into her abdomen and felt like a pressure sensation up into her upper abdomen.  She denies any overt chest pain, shortness of breath.  She denies ever having anything like this previously.  She does report prior C-sections.  I reviewed the note where patient was seen today by Larkin Plumb   Physical Exam   Triage Vital Signs: ED Triage Vitals  Encounter Vitals Group     BP 07/01/23 2027 (!) 164/91     Systolic BP Percentile --      Diastolic BP Percentile --      Pulse Rate 07/01/23 2027 86     Resp 07/01/23 2027 18     Temp 07/01/23 2027 98.3 F (36.8 C)     Temp src --      SpO2 07/01/23 2027 100 %     Weight 07/01/23 2200 161 lb (73 kg)     Height 07/01/23 2200 5' (1.524 m)     Head Circumference --      Peak Flow --      Pain Score 07/01/23 2026 10     Pain Loc --      Pain Education --      Exclude from Growth Chart --     Most recent vital signs: Vitals:   07/01/23 2027 07/01/23 2200  BP: (!) 164/91 139/88  Pulse: 86 82  Resp: 18 16  Temp: 98.3 F (36.8 C)   SpO2: 100% 96%     General: Awake, no distress.  CV:  Good peripheral perfusion.  Resp:  Normal effort.  Abd:  No distention.  Slightly tender throughout her abdomen Other:  Rectal exam without any obvious pain.  No stool in the vault.  No obvious hemorrhoids no rectal bleeding   ED Results / Procedures / Treatments   Labs (all labs ordered are listed, but only  abnormal results are displayed) Labs Reviewed  URINALYSIS, ROUTINE W REFLEX MICROSCOPIC - Abnormal; Notable for the following components:      Result Value   Color, Urine STRAW (*)    APPearance CLEAR (*)    All other components within normal limits  COMPREHENSIVE METABOLIC PANEL WITH GFR - Abnormal; Notable for the following components:   Glucose, Bld 101 (*)    All other components within normal limits  CBC WITH DIFFERENTIAL/PLATELET  LIPASE, BLOOD  TROPONIN I (HIGH SENSITIVITY)  TROPONIN I (HIGH SENSITIVITY)     EKG  My interpretation of EKG:  Normal sinus rate of 81 without any ST elevation or T wave inversions, normal intervals  RADIOLOGY Pending  PROCEDURES:  Critical Care performed: No  Procedures   MEDICATIONS ORDERED IN ED: Medications  iohexol  (OMNIPAQUE ) 300 MG/ML solution 100 mL (100 mLs Intravenous Contrast Given 07/01/23 2252)     IMPRESSION / MDM / ASSESSMENT AND PLAN / ED COURSE  I reviewed the triage vital signs and the  nursing notes.   Patient's presentation is most consistent with acute presentation with potential threat to life or bodily function.   Patient comes in with abdominal pain, rectal pain no obvious finding on examination discussed with patient we will get CT imaging to rule out any type of obstruction, perforation.  No chest pain or shortness of breath but given she does report the pain radiating into the upper abdomen will get EKG, cardiac markers to make sure no ACS.  Lipase normal CMP with stable creatinine CBC reassuring troponin is negative urine without evidence of UTI  Patient handed off to oncoming team pending CT imaging    FINAL CLINICAL IMPRESSION(S) / ED DIAGNOSES   Final diagnoses:  Rectal pain     Rx / DC Orders   ED Discharge Orders     None        Note:  This document was prepared using Dragon voice recognition software and may include unintentional dictation errors.   Lubertha Rush, MD 07/01/23  2300

## 2023-07-02 MED ORDER — HYDROCODONE-ACETAMINOPHEN 5-325 MG PO TABS: 1.0000 | ORAL_TABLET | Freq: Four times a day (QID) | ORAL | 0 refills | Status: DC | PRN

## 2023-07-02 MED ORDER — AMOXICILLIN-POT CLAVULANATE 875-125 MG PO TABS: 1.0000 | ORAL_TABLET | Freq: Two times a day (BID) | ORAL | 0 refills | Status: DC

## 2023-07-02 MED ORDER — ONDANSETRON 4 MG PO TBDP: 4.0000 mg | ORAL_TABLET | Freq: Three times a day (TID) | ORAL | 0 refills | Status: DC | PRN

## 2023-07-02 NOTE — Discharge Instructions (Signed)
 Take and finish antibiotic as prescribed.  You may take pain and nausea medicines as needed.  Return to the ER for worsening symptoms, persistent vomiting, difficulty breathing, fever or other concerns.

## 2023-07-09 ENCOUNTER — Encounter: Admitting: Physician Assistant

## 2023-07-28 ENCOUNTER — Other Ambulatory Visit: Payer: Self-pay | Admitting: Physician Assistant

## 2023-07-28 DIAGNOSIS — K581 Irritable bowel syndrome with constipation: Secondary | ICD-10-CM

## 2023-07-28 NOTE — Telephone Encounter (Signed)
 Copied from CRM [id number]. Topic: Clinical - Medication Refill >> Jul 28, 2023  4:20 PM Leory Rands wrote: Medication: LINZESS  145 MCG CAPS capsule [[id number]]  Has the patient contacted their pharmacy? Yes (Agent: If no, request that the patient contact the pharmacy for the refill. If patient does not wish to contact the pharmacy document the reason why and proceed with request.) (Agent: If yes, when and what did the pharmacy advise?)  This is the patient's preferred pharmacy:  [id number] - [place], [place] - [address] [place] [address] Phone: [phone] Fax: [phone]  Is this the correct pharmacy for this prescription? Yes If no, delete pharmacy and type the correct one.   Has the prescription been filled recently? Yes  Is the patient out of the medication? Yes  Has the patient been seen for an appointment in the last year OR does the patient have an upcoming appointment? Yes  Can we respond through MyChart? Yes  Agent: Please be advised that Rx refills may take up to 3 business days. We ask that you follow-up with your pharmacy.

## 2023-07-31 ENCOUNTER — Other Ambulatory Visit: Payer: Self-pay | Admitting: Physician Assistant

## 2023-07-31 DIAGNOSIS — R110 Nausea: Secondary | ICD-10-CM

## 2023-07-31 DIAGNOSIS — E785 Hyperlipidemia, unspecified: Secondary | ICD-10-CM

## 2023-07-31 DIAGNOSIS — K581 Irritable bowel syndrome with constipation: Secondary | ICD-10-CM

## 2023-08-01 NOTE — Telephone Encounter (Signed)
 Requested medication (s) are due for refill today: na   Requested medication (s) are on the active medication list: yes   Last refill:  linzess - 05/28/23  #90 0 refill, zofran  - 07/02/23 #15 0 refills  Future visit scheduled: no   Notes to clinic:  last OV 07/01/23. Zofran  not delegated per protocol. Last ordered by Meredith Stalls, MD do you want to refill Rx? Linzess  requested again . Do you want to refill Rx?     Requested Prescriptions  Pending Prescriptions Disp Refills   LINZESS  145 MCG CAPS capsule [Pharmacy Med Name: LINZESS  145 MCG CAPSULE] 90 capsule 0    Sig: TAKE 1 CAPSULE BY MOUTH EVERY DAY     Gastroenterology: Irritable Bowel Syndrome Failed - 08/01/2023  4:24 PM      Failed - Valid encounter within last 12 months    Recent Outpatient Visits           1 month ago Rectal discomfort   [place] [facility] at [facility], [place], [place]               ondansetron  (ZOFRAN -ODT) 4 MG disintegrating tablet [Pharmacy Med Name: ONDANSETRON  ODT 4 MG TABLET] 9 tablet 3    Sig: ALLOW 1-2 TABLETS TO DISSOLVE IN YOUR MOUTH EVERY 8 HOURS AS NEEDED FOR NAUSEA/VOMITING     Not Delegated - Gastroenterology: Antiemetics - ondansetron  Failed - 08/01/2023  4:24 PM      Failed - This refill cannot be delegated      Failed - Valid encounter within last 6 months    Recent Outpatient Visits           1 month ago Rectal discomfort   [place] [facility] at [facility], [place], [place]              Passed - AST in normal range and within 360 days    AST  Date Value Ref Range Status  07/01/2023 28 15 - 41 U/L Final   SGOT(AST)  Date Value Ref Range Status  04/13/2014 32 15 - 37 Unit/L Final         Passed - ALT in normal range and within 360 days    ALT  Date Value Ref Range Status  07/01/2023 27 0 - 44 U/L Final   SGPT (ALT)  Date Value Ref Range Status  04/13/2014 36 14 - 63 U/L Final         Refused  Prescriptions Disp Refills   atorvastatin  (LIPITOR) 20 MG tablet [Pharmacy Med Name: ATORVASTATIN  20 MG TABLET] 90 tablet 1    Sig: TAKE 1 TABLET BY MOUTH EVERY DAY     Cardiovascular:  Antilipid - Statins Failed - 08/01/2023  4:24 PM      Failed - Valid encounter within last 12 months    Recent Outpatient Visits           1 month ago Rectal discomfort   [place] [facility] at [facility], [place], [place]              Failed - Lipid Panel in normal range within the last 12 months    Cholesterol, Total  Date Value Ref Range Status  07/01/2022 180 100 - 199 mg/dL Final   LDL Chol Calc (NIH)  Date Value Ref Range Status  07/01/2022 96 0 - 99 mg/dL Final   HDL  Date Value Ref  Range Status  07/01/2022 72 >39 mg/dL Final   Triglycerides  Date Value Ref Range Status  07/01/2022 64 0 - 149 mg/dL Final         Passed - Patient is not pregnant

## 2023-08-01 NOTE — Telephone Encounter (Signed)
 Requested by interface surescripts. Medication discontinued 07/03/22 Requested Prescriptions  Pending Prescriptions Disp Refills   LINZESS  145 MCG CAPS capsule [Pharmacy Med Name: LINZESS  145 MCG CAPSULE] 90 capsule 0    Sig: TAKE 1 CAPSULE BY MOUTH EVERY DAY     Gastroenterology: Irritable Bowel Syndrome Failed - 08/01/2023  4:19 PM      Failed - Valid encounter within last 12 months    Recent Outpatient Visits           1 month ago Rectal discomfort   [place] [facility] at [facility], [place]               ondansetron  (ZOFRAN -ODT) 4 MG disintegrating tablet [Pharmacy Med Name: ONDANSETRON  ODT 4 MG TABLET] 9 tablet 3    Sig: ALLOW 1-2 TABLETS TO DISSOLVE IN YOUR MOUTH EVERY 8 HOURS AS NEEDED FOR NAUSEA/VOMITING     Not Delegated - Gastroenterology: Antiemetics - ondansetron  Failed - 08/01/2023  4:19 PM      Failed - This refill cannot be delegated      Failed - Valid encounter within last 6 months    Recent Outpatient Visits           1 month ago Rectal discomfort   [place] [facility] at [facility], [place], [place]              Passed - AST in normal range and within 360 days    AST  Date Value Ref Range Status  07/01/2023 28 15 - 41 U/L Final   SGOT(AST)  Date Value Ref Range Status  04/13/2014 32 15 - 37 Unit/L Final         Passed - ALT in normal range and within 360 days    ALT  Date Value Ref Range Status  07/01/2023 27 0 - 44 U/L Final   SGPT (ALT)  Date Value Ref Range Status  04/13/2014 36 14 - 63 U/L Final         Refused Prescriptions Disp Refills   atorvastatin  (LIPITOR) 20 MG tablet [Pharmacy Med Name: ATORVASTATIN  20 MG TABLET] 90 tablet 1    Sig: TAKE 1 TABLET BY MOUTH EVERY DAY     Cardiovascular:  Antilipid - Statins Failed - 08/01/2023  4:19 PM      Failed - Valid encounter within last 12 months    Recent Outpatient Visits           1 month ago Rectal  discomfort   [place] [facility] at [facility], [place], [place]              Failed - Lipid Panel in normal range within the last 12 months    Cholesterol, Total  Date Value Ref Range Status  07/01/2022 180 100 - 199 mg/dL Final   LDL Chol Calc (NIH)  Date Value Ref Range Status  07/01/2022 96 0 - 99 mg/dL Final   HDL  Date Value Ref Range Status  07/01/2022 72 >39 mg/dL Final   Triglycerides  Date Value Ref Range Status  07/01/2022 64 0 - 149 mg/dL Final         Passed - Patient is not pregnant

## 2023-08-02 ENCOUNTER — Other Ambulatory Visit: Payer: Self-pay | Admitting: Physician Assistant

## 2023-08-02 DIAGNOSIS — M62838 Other muscle spasm: Secondary | ICD-10-CM

## 2023-08-05 NOTE — Telephone Encounter (Signed)
 Please review

## 2023-08-06 NOTE — Telephone Encounter (Signed)
 Please review.  KP

## 2023-08-06 NOTE — Telephone Encounter (Signed)
 Requested medication (s) are due for refill today: yes  Requested medication (s) are on the active medication list: yes  Last refill:  06/02/23  Future visit scheduled: no  Notes to clinic:  Unable to refill per protocol, cannot delegate.      Requested Prescriptions  Pending Prescriptions Disp Refills   cyclobenzaprine  (FLEXERIL ) 10 MG tablet [Pharmacy Med Name: CYCLOBENZAPRINE  10 MG TABLET] 90 tablet 0    Sig: TAKE 1 TABLET BY MOUTH THREE TIMES A DAY AS NEEDED FOR MUSCLE SPASMS     Not Delegated - Analgesics:  Muscle Relaxants Failed - 08/06/2023  9:54 AM      Failed - This refill cannot be delegated      Failed - Valid encounter within last 6 months    Recent Outpatient Visits           1 month ago Rectal discomfort   [facility] at [facility], [place], [place]

## 2023-08-06 NOTE — Telephone Encounter (Signed)
 Spoke with patient via phone just now to remind of below message.

## 2023-08-26 ENCOUNTER — Telehealth: Payer: Self-pay

## 2023-08-26 NOTE — Telephone Encounter (Signed)
Please call pt to schedule a appt.

## 2023-09-22 DIAGNOSIS — B9689 Other specified bacterial agents as the cause of diseases classified elsewhere: Secondary | ICD-10-CM | POA: Diagnosis not present

## 2023-09-22 DIAGNOSIS — J019 Acute sinusitis, unspecified: Secondary | ICD-10-CM | POA: Diagnosis not present

## 2023-09-22 DIAGNOSIS — K5909 Other constipation: Secondary | ICD-10-CM | POA: Diagnosis not present

## 2023-11-08 ENCOUNTER — Other Ambulatory Visit: Payer: Self-pay | Admitting: Physician Assistant

## 2023-11-10 NOTE — Telephone Encounter (Signed)
 Courtesy refill given, appointment needed, noted to call and schedule.    Requested Prescriptions  Pending Prescriptions Disp Refills   olmesartan -hydrochlorothiazide (BENICAR  HCT) 40-25 MG tablet [Pharmacy Med Name: OLMESARTAN -HCTZ 40-25 MG TAB] 30 tablet 0    Sig: Take 1 tablet by mouth daily. OFFICE VISIT NEEDED FOR ADDITIONAL REFILLS, CALL OFFICE TO SCHEDULE VISIT     Cardiovascular: ARB + Diuretic Combos Failed - 11/10/2023  9:48 AM      Failed - Valid encounter within last 6 months    Recent Outpatient Visits           4 months ago Rectal discomfort   [place] [facility] at [facility], [place], [place]              Passed - K in normal range and within 180 days    Potassium  Date Value Ref Range Status  07/01/2023 3.7 3.5 - 5.1 mmol/L Final  04/13/2014 3.5 3.5 - 5.1 mmol/L Final         Passed - Na in normal range and within 180 days    Sodium  Date Value Ref Range Status  07/01/2023 136 135 - 145 mmol/L Final  07/01/2022 142 134 - 144 mmol/L Final  04/13/2014 138 136 - 145 mmol/L Final         Passed - Cr in normal range and within 180 days    Creatinine  Date Value Ref Range Status  04/13/2014 0.73 0.60 - 1.30 mg/dL Final   Creatinine, Ser  Date Value Ref Range Status  07/01/2023 0.63 0.44 - 1.00 mg/dL Final         Passed - eGFR is 10 or above and within 180 days    EGFR (African American)  Date Value Ref Range Status  04/13/2014 >60 >81mL/min Final  06/19/2013 >60  Final   GFR calc Af Amer  Date Value Ref Range Status  04/19/2019 >60 >60 mL/min Final   EGFR (Non-African Amer.)  Date Value Ref Range Status  04/13/2014 >60 >9mL/min Final    Comment:    eGFR values <30mL/min/1.73 m2 may be an indication of chronic kidney disease (CKD). Calculated eGFR, using the MRDR Study equation, is useful in  patients with stable renal function. The eGFR calculation will not be reliable in acutely ill patients when serum  creatinine is changing rapidly. It is not useful in patients on dialysis. The eGFR calculation may not be applicable to patients at the low and high extremes of body sizes, pregnant women, and vegetarians.   06/19/2013 >60  Final    Comment:    eGFR values <37mL/min/1.73 m2 may be an indication of chronic kidney disease (CKD). Calculated eGFR is useful in patients with stable renal function. The eGFR calculation will not be reliable in acutely ill patients when serum creatinine is changing rapidly. It is not useful in  patients on dialysis. The eGFR calculation may not be applicable to patients at the low and high extremes of body sizes, pregnant women, and vegetarians.    GFR, Estimated  Date Value Ref Range Status  07/01/2023 >60 >60 mL/min Final    Comment:    (NOTE) Calculated using the CKD-EPI Creatinine Equation (2021)    eGFR  Date Value Ref Range Status  07/01/2022 98 >59 mL/min/1.73 Final         Passed - Patient is not pregnant      Passed - Last BP in normal range    BP  Readings from Last 1 Encounters:  07/02/23 122/66

## 2023-11-15 ENCOUNTER — Emergency Department

## 2023-11-15 ENCOUNTER — Other Ambulatory Visit: Payer: Self-pay

## 2023-11-15 ENCOUNTER — Emergency Department
Admission: EM | Admit: 2023-11-15 | Discharge: 2023-11-15 | Disposition: A | Discharge status: Home / Self Care | Attending: Emergency Medicine | Admitting: Emergency Medicine

## 2023-11-15 DIAGNOSIS — U071 COVID-19: Secondary | ICD-10-CM | POA: Diagnosis not present

## 2023-11-15 DIAGNOSIS — I10 Essential (primary) hypertension: Secondary | ICD-10-CM | POA: Insufficient documentation

## 2023-11-15 DIAGNOSIS — D72819 Decreased white blood cell count, unspecified: Secondary | ICD-10-CM | POA: Diagnosis not present

## 2023-11-15 DIAGNOSIS — R059 Cough, unspecified: Secondary | ICD-10-CM | POA: Diagnosis not present

## 2023-11-15 DIAGNOSIS — R519 Headache, unspecified: Secondary | ICD-10-CM | POA: Diagnosis not present

## 2023-11-15 DIAGNOSIS — R0789 Other chest pain: Secondary | ICD-10-CM | POA: Diagnosis not present

## 2023-11-15 LAB — CBC
HCT: 39.6 % (ref 36.0–46.0)
Hemoglobin: 12.7 g/dL (ref 12.0–15.0)
MCH: 28.5 pg (ref 26.0–34.0)
MCHC: 32.1 g/dL (ref 30.0–36.0)
MCV: 89 fL (ref 80.0–100.0)
Platelets: 299 K/uL (ref 150–400)
RBC: 4.45 MIL/uL (ref 3.87–5.11)
RDW: 13.6 % (ref 11.5–15.5)
WBC: 3.6 K/uL — ABNORMAL LOW (ref 4.0–10.5)
nRBC: 0 % (ref 0.0–0.2)

## 2023-11-15 LAB — BASIC METABOLIC PANEL WITH GFR
Anion gap: 11 (ref 5–15)
BUN: 10 mg/dL (ref 8–23)
CO2: 25 mmol/L (ref 22–32)
Calcium: 9.3 mg/dL (ref 8.9–10.3)
Chloride: 100 mmol/L (ref 98–111)
Creatinine, Ser: 0.56 mg/dL (ref 0.44–1.00)
GFR, Estimated: >60 mL/min (ref >60)
Glucose, Bld: 108 mg/dL — ABNORMAL HIGH (ref 70–99)
Potassium: 3.6 mmol/L (ref 3.5–5.1)
Sodium: 136 mmol/L (ref 135–145)

## 2023-11-15 LAB — RESP PANEL BY RT-PCR (RSV, FLU A&B, COVID)  RVPGX2
Influenza A by PCR: NEGATIVE
Influenza B by PCR: NEGATIVE
Resp Syncytial Virus by PCR: NEGATIVE
SARS Coronavirus 2 by RT PCR: POSITIVE — AB

## 2023-11-15 LAB — TROPONIN I (HIGH SENSITIVITY): Troponin I (High Sensitivity): <2 ng/L (ref <18)

## 2023-11-15 IMAGING — CR DG CHEST 2V
2 series · 2 of 2 positions shown · non-contrast
Comparison: 04/19/2019

CLINICAL DATA: Chest pain this morning after lifting a heavy box.

EXAM:
CHEST - 2 VIEW

[chest lat]
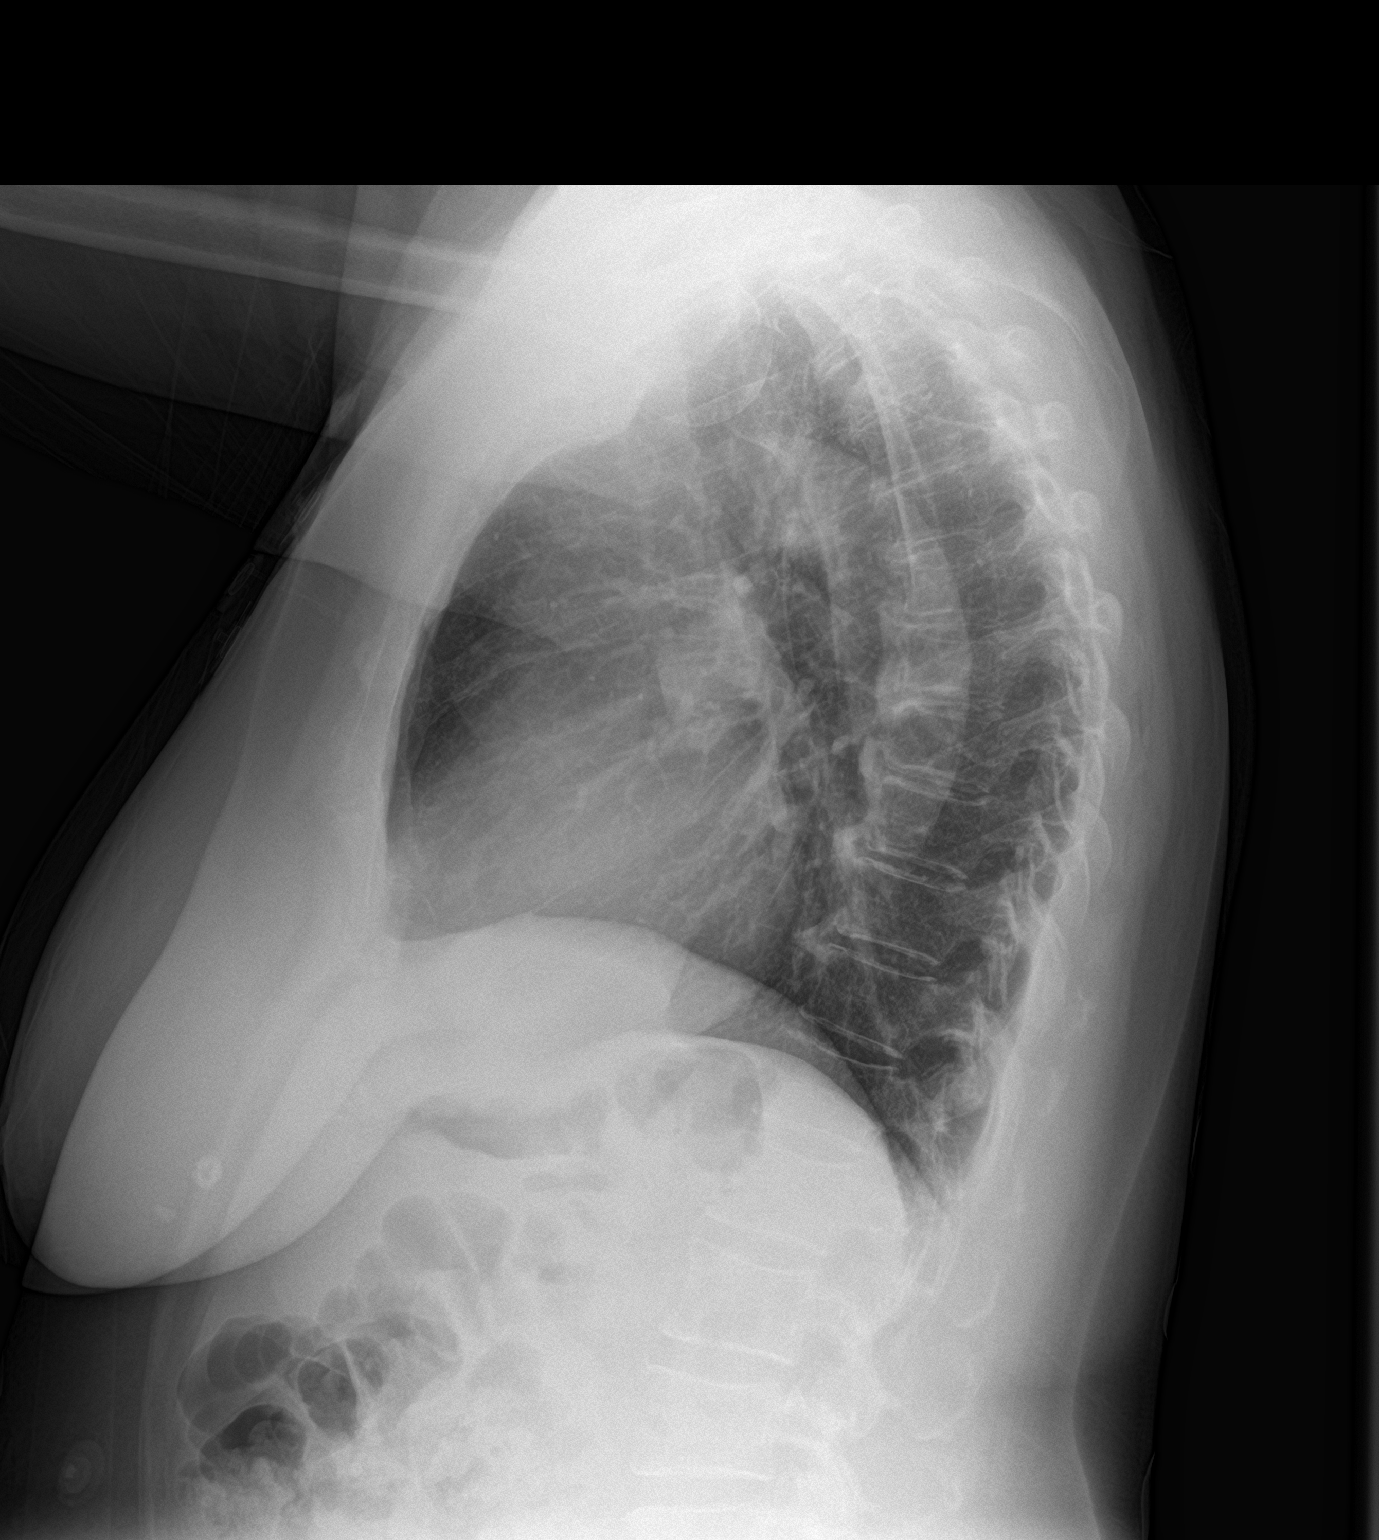

[chest pa]
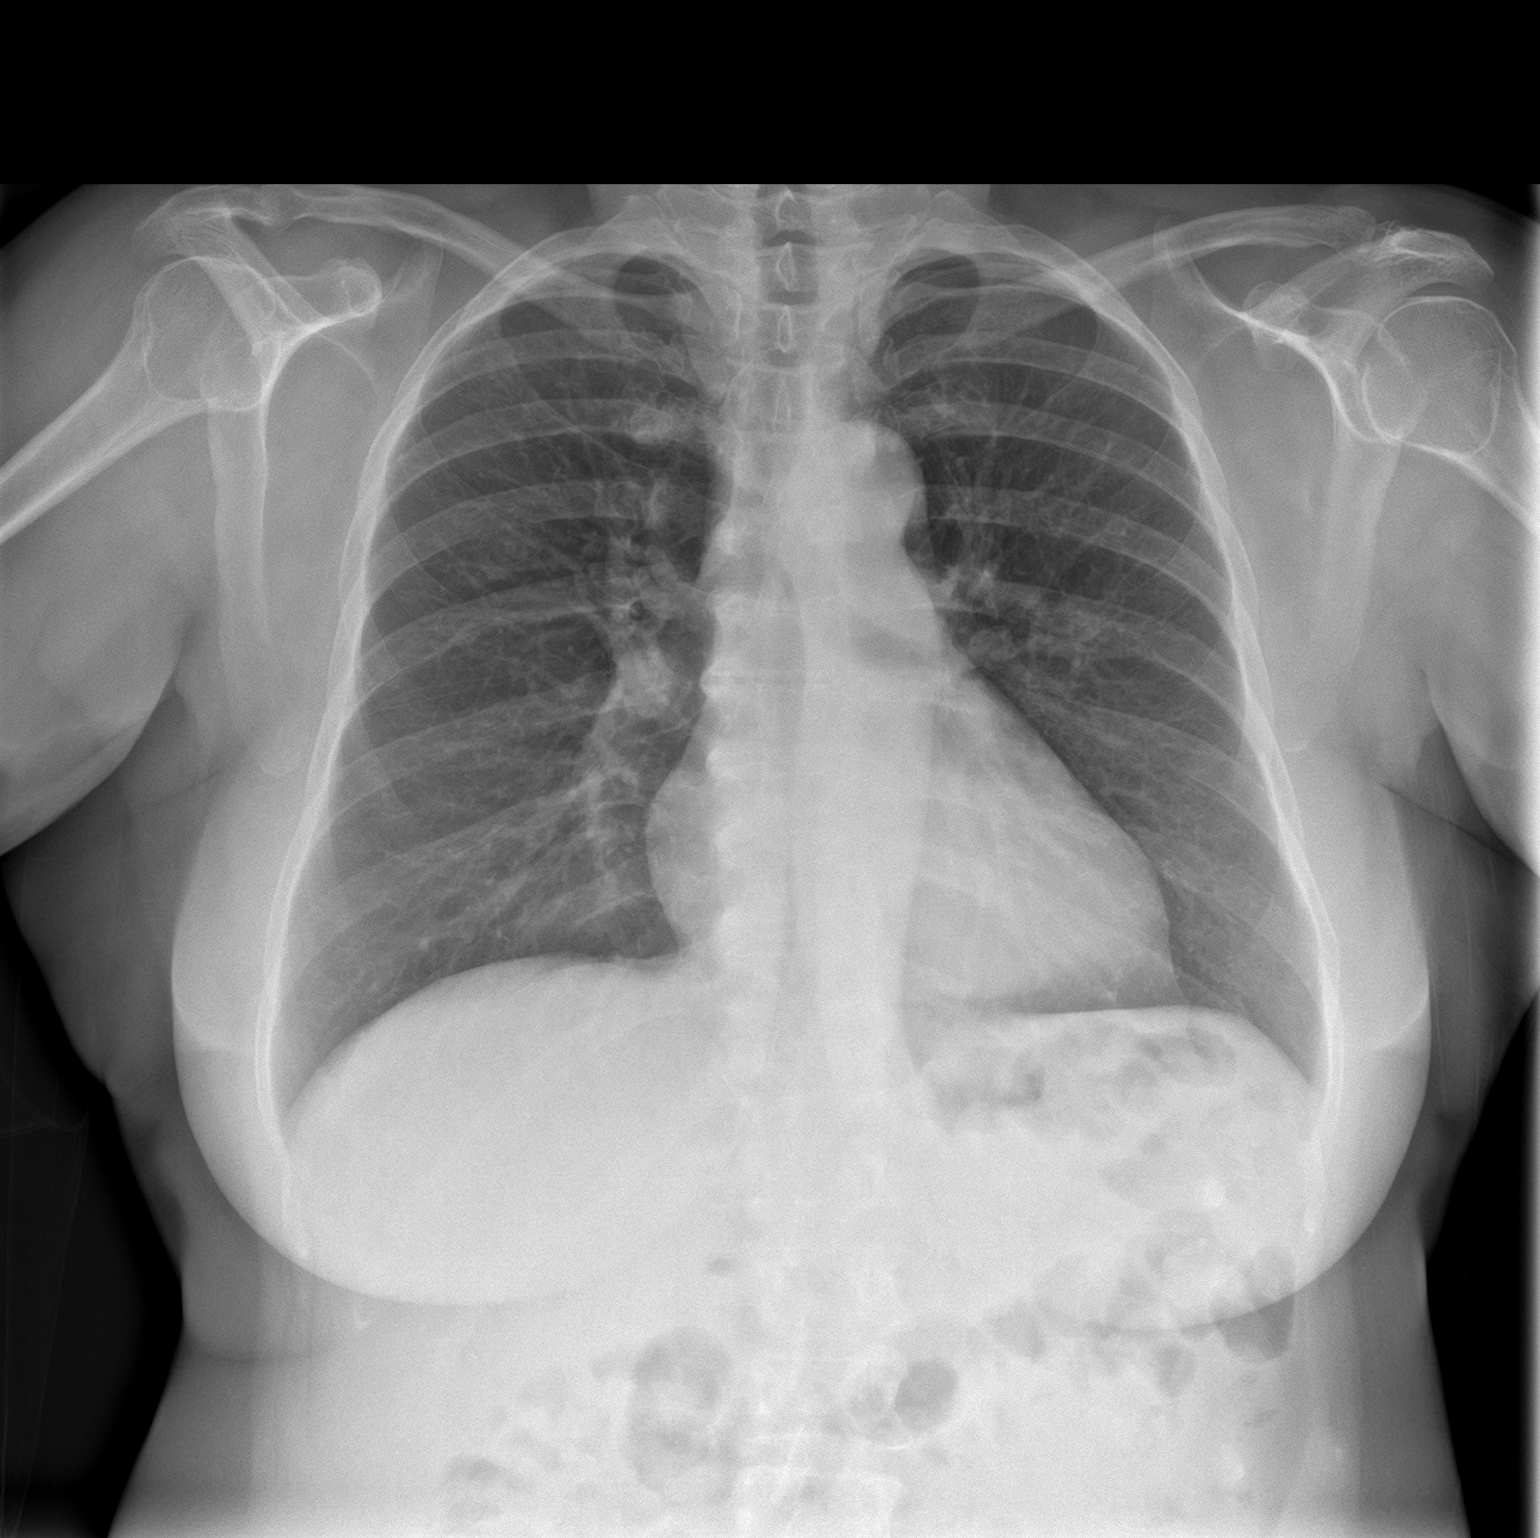

[2 of 2 positions shown; findings below may reference images not displayed]

FINDINGS: Normal sized heart. Clear lungs with normal vascularity. Thoracic
spine degenerative changes. No fracture or pneumothorax.
IMPRESSION: No acute abnormality.

## 2023-11-15 MED ORDER — ONDANSETRON 4 MG PO TBDP
ORAL_TABLET | ORAL | Status: AC
  Filled 2023-11-15: qty 1

## 2023-11-15 MED ORDER — ONDANSETRON 4 MG PO TBDP: 4.0000 mg | ORAL_TABLET | Freq: Three times a day (TID) | ORAL | 0 refills | Status: DC | PRN

## 2023-11-15 MED ORDER — ONDANSETRON 4 MG PO TBDP
4.0000 mg | ORAL_TABLET | Freq: Once | ORAL | Status: AC
  Administered 2023-11-15: 4 mg via ORAL

## 2023-11-15 NOTE — ED Triage Notes (Signed)
 Pt to ED for cough, nausea and chest pressure since 1 week. Pt states HA is 10/10. Has intermittent throbbing like a toothache pain in chest (none currently). Pt in NAD, respirations unlabored.

## 2023-11-15 NOTE — Discharge Instructions (Addendum)
 I have sent a refill of the nausea medication.  Your blood work, chest x-ray and EKG were normal today.  You tested positive for COVID today.  This is a viral illness which will resolve on its own with time.  You do not need an antibiotic.  You can take over-the-counter cold medicine as needed to manage your symptoms.  If you are taking combination cold medicine keep in mind that this often contains Tylenol  so if you need additional medication for body aches or fever control please take Motrin  or ibuprofen .  Your symptoms should resolve with time, if you have had symptoms for greater than 10 days please be evaluated by another healthcare provider as at this point it may have developed into a bacterial infection which requires a different treatment.  Return to the emergency department with worsening symptoms.

## 2023-11-15 NOTE — ED Provider Notes (Signed)
 [facility] Provider Note    Event Date/Time   First MD Initiated Contact with Patient 11/15/23 1132     (approximate)   History   Chest Pain and Cough   HPI  Shelley Flowers is a 63 y.o. female with PMH of hypertension, IBS and GERD who presents for evaluation of chest pressure and cough.  Patient states that she has had symptoms for about a week.  She also endorses a throbbing headache, cough and congestion.  Unsure if she has had a fever.      Physical Exam   Triage Vital Signs: ED Triage Vitals  Encounter Vitals Group     BP 11/15/23 0955 (!) 177/92     Girls Systolic BP Percentile --      Girls Diastolic BP Percentile --      Boys Systolic BP Percentile --      Boys Diastolic BP Percentile --      Pulse Rate 11/15/23 0955 72     Resp 11/15/23 0955 20     Temp 11/15/23 0955 (!) 97.5 F (36.4 C)     Temp Source 11/15/23 0955 Oral     SpO2 11/15/23 0955 100 %     Weight 11/15/23 0958 140 lb (63.5 kg)     Height 11/15/23 0958 5' (1.524 m)     Head Circumference --      Peak Flow --      Pain Score 11/15/23 0956 10     Pain Loc --      Pain Education --      Exclude from Growth Chart --     Most recent vital signs: Vitals:   11/15/23 1018 11/15/23 1059  BP: (!) 142/88 (!) 152/89  Pulse:    Resp:  20  Temp:    SpO2:     General: Awake, no distress.  CV:  Good peripheral perfusion.  RRR. Resp:  Normal effort.  CTAB Abd:  No distention.  Other:     ED Results / Procedures / Treatments   Labs (all labs ordered are listed, but only abnormal results are displayed) Labs Reviewed  RESP PANEL BY RT-PCR (RSV, FLU A&B, COVID)  RVPGX2 - Abnormal; Notable for the following components:      Result Value   SARS Coronavirus 2 by RT PCR POSITIVE (*)    All other components within normal limits  BASIC METABOLIC PANEL WITH GFR - Abnormal; Notable for the following components:   Glucose, Bld 108 (*)    All other components within normal  limits  CBC - Abnormal; Notable for the following components:   WBC 3.6 (*)    All other components within normal limits  TROPONIN I (HIGH SENSITIVITY)  TROPONIN I (HIGH SENSITIVITY)     EKG  ED provider interpretation: Normal sinus rhythm no ST changes  Vent. rate 68 BPM PR interval 150 ms QRS duration 76 ms QT/QTcB 420/446 ms P-R-T axes 54 61 44   RADIOLOGY  Chest x-ray obtained, I interpreted the images as well as reviewed the radiologist report, which was negative for any acute cardiopulmonary abnormalities.   PROCEDURES:  Critical Care performed: No  Procedures   MEDICATIONS ORDERED IN ED: Medications - No data to display   IMPRESSION / MDM / ASSESSMENT AND PLAN / ED COURSE  I reviewed the triage vital signs and the nursing notes.  63 year old female presents for evaluation of chest pain and cough.  Blood pressure is elevated otherwise vital signs are stable.  Patient NAD on exam.  Differential diagnosis includes, but is not limited to, flu, COVID, RSV, other viral infection, pneumonia, less likely ACS and PE given URI symptoms.  Patient's presentation is most consistent with acute complicated illness / injury requiring diagnostic workup.  CBC and BMP are unremarkable.  Troponin is not elevated.  Chest x-ray is negative.  EKG shows normal sinus rhythm.  Respiratory panel positive for COVID which I believe best explains her constellation of symptoms.  Very low suspicion for ACS given negative troponin and normal EKG.  Do not suspect PE as patient does not have pain with deep breaths and is not feeling short of breath.  She describes her chest pain as more of a tightness.  Feel that the additional URI symptoms better fit with COVID as a cause of the chest tightness and a PE. Chest x-ray was clear for pneumonia.  Advised patient to continue symptomatic management using Tylenol , ibuprofen  and OTC cold medicines.  Patient did not need a note  for work.  She voiced understanding, all questions were answered and she was stable at discharge.      FINAL CLINICAL IMPRESSION(S) / ED DIAGNOSES   Final diagnoses:  COVID     Rx / DC Orders   ED Discharge Orders          Ordered    ondansetron  (ZOFRAN -ODT) 4 MG disintegrating tablet  Every 8 hours PRN,   Status:  Discontinued        11/15/23 1157    ondansetron  (ZOFRAN -ODT) 4 MG disintegrating tablet  Every 8 hours PRN        11/15/23 1158             Note:  This document was prepared using Dragon voice recognition software and may include unintentional dictation errors.   Cleaster Tinnie LABOR, PA-C 11/15/23 1159    Levander Slate, MD 11/15/23 1723

## 2023-12-04 ENCOUNTER — Other Ambulatory Visit: Payer: Self-pay | Admitting: Physician Assistant

## 2023-12-05 NOTE — Telephone Encounter (Signed)
 Courtesy refill given, appointment needed.   Requested Prescriptions  Pending Prescriptions Disp Refills   olmesartan -hydrochlorothiazide (BENICAR  HCT) 40-25 MG tablet [Pharmacy Med Name: OLMESARTAN -HCTZ 40-25 MG TAB] 90 tablet 1    Sig: TAKE 1 TABLET BY MOUTH DAILY. OFFICE VISIT NEEDED FOR ADDITIONAL REFILLS, CALL OFFICE TO SCHEDULE VISIT     Cardiovascular: ARB + Diuretic Combos Failed - 12/05/2023  2:25 PM      Failed - Last BP in normal range    BP Readings from Last 1 Encounters:  11/15/23 (!) 144/82         Failed - Valid encounter within last 6 months    Recent Outpatient Visits           5 months ago Rectal discomfort    [facility] & [facility] Medicine at [facility], [place], [place]              Passed - K in normal range and within 180 days    Potassium  Date Value Ref Range Status  11/15/2023 3.6 3.5 - 5.1 mmol/L Final  04/13/2014 3.5 3.5 - 5.1 mmol/L Final         Passed - Na in normal range and within 180 days    Sodium  Date Value Ref Range Status  11/15/2023 136 135 - 145 mmol/L Final  07/01/2022 142 134 - 144 mmol/L Final  04/13/2014 138 136 - 145 mmol/L Final         Passed - Cr in normal range and within 180 days    Creatinine  Date Value Ref Range Status  04/13/2014 0.73 0.60 - 1.30 mg/dL Final   Creatinine, Ser  Date Value Ref Range Status  11/15/2023 0.56 0.44 - 1.00 mg/dL Final         Passed - eGFR is 10 or above and within 180 days    EGFR (African American)  Date Value Ref Range Status  04/13/2014 >60 >41mL/min Final  06/19/2013 >60  Final   GFR calc Af Amer  Date Value Ref Range Status  04/19/2019 >60 >60 mL/min Final   EGFR (Non-African Amer.)  Date Value Ref Range Status  04/13/2014 >60 >59mL/min Final    Comment:    eGFR values <31mL/min/1.73 m2 may be an indication of chronic kidney disease (CKD). Calculated eGFR, using the MRDR Study equation, is useful in  patients with stable renal function. The  eGFR calculation will not be reliable in acutely ill patients when serum creatinine is changing rapidly. It is not useful in patients on dialysis. The eGFR calculation may not be applicable to patients at the low and high extremes of body sizes, pregnant women, and vegetarians.   06/19/2013 >60  Final    Comment:    eGFR values <46mL/min/1.73 m2 may be an indication of chronic kidney disease (CKD). Calculated eGFR is useful in patients with stable renal function. The eGFR calculation will not be reliable in acutely ill patients when serum creatinine is changing rapidly. It is not useful in  patients on dialysis. The eGFR calculation may not be applicable to patients at the low and high extremes of body sizes, pregnant women, and vegetarians.    GFR, Estimated  Date Value Ref Range Status  11/15/2023 >60 >60 mL/min Final    Comment:    (NOTE) Calculated using the CKD-EPI Creatinine Equation (2021)    eGFR  Date Value Ref Range Status  07/01/2022 98 >59 mL/min/1.73 Final  Passed - Patient is not pregnant

## 2023-12-06 ENCOUNTER — Other Ambulatory Visit: Payer: Self-pay | Admitting: Physician Assistant

## 2023-12-09 NOTE — Telephone Encounter (Signed)
 Duplicate request, OV needed.  Requested Prescriptions  Pending Prescriptions Disp Refills   olmesartan -hydrochlorothiazide (BENICAR  HCT) 40-25 MG tablet [Pharmacy Med Name: OLMESARTAN -HCTZ 40-25 MG TAB] 30 tablet 0    Sig: TAKE 1 TABLET BY MOUTH DAILY. OFFICE VISIT NEEDED FOR ADDITIONAL REFILLS, CALL OFFICE TO SCHEDULE VISIT     Cardiovascular: ARB + Diuretic Combos Failed - 12/09/2023  9:11 AM      Failed - Last BP in normal range    BP Readings from Last 1 Encounters:  11/15/23 (!) 144/82         Failed - Valid encounter within last 6 months    Recent Outpatient Visits           5 months ago Rectal discomfort   [place] [facility] at [facility], [place], [place]              Passed - K in normal range and within 180 days    Potassium  Date Value Ref Range Status  11/15/2023 3.6 3.5 - 5.1 mmol/L Final  04/13/2014 3.5 3.5 - 5.1 mmol/L Final         Passed - Na in normal range and within 180 days    Sodium  Date Value Ref Range Status  11/15/2023 136 135 - 145 mmol/L Final  07/01/2022 142 134 - 144 mmol/L Final  04/13/2014 138 136 - 145 mmol/L Final         Passed - Cr in normal range and within 180 days    Creatinine  Date Value Ref Range Status  04/13/2014 0.73 0.60 - 1.30 mg/dL Final   Creatinine, Ser  Date Value Ref Range Status  11/15/2023 0.56 0.44 - 1.00 mg/dL Final         Passed - eGFR is 10 or above and within 180 days    EGFR (African American)  Date Value Ref Range Status  04/13/2014 >60 >4mL/min Final  06/19/2013 >60  Final   GFR calc Af Amer  Date Value Ref Range Status  04/19/2019 >60 >60 mL/min Final   EGFR (Non-African Amer.)  Date Value Ref Range Status  04/13/2014 >60 >60mL/min Final    Comment:    eGFR values <31mL/min/1.73 m2 may be an indication of chronic kidney disease (CKD). Calculated eGFR, using the MRDR Study equation, is useful in  patients with stable renal function. The eGFR  calculation will not be reliable in acutely ill patients when serum creatinine is changing rapidly. It is not useful in patients on dialysis. The eGFR calculation may not be applicable to patients at the low and high extremes of body sizes, pregnant women, and vegetarians.   06/19/2013 >60  Final    Comment:    eGFR values <80mL/min/1.73 m2 may be an indication of chronic kidney disease (CKD). Calculated eGFR is useful in patients with stable renal function. The eGFR calculation will not be reliable in acutely ill patients when serum creatinine is changing rapidly. It is not useful in  patients on dialysis. The eGFR calculation may not be applicable to patients at the low and high extremes of body sizes, pregnant women, and vegetarians.    GFR, Estimated  Date Value Ref Range Status  11/15/2023 >60 >60 mL/min Final    Comment:    (NOTE) Calculated using the CKD-EPI Creatinine Equation (2021)    eGFR  Date Value Ref Range Status  07/01/2022 98 >59 mL/min/1.73 Final         Passed -  Patient is not pregnant

## 2024-02-19 DIAGNOSIS — H9201 Otalgia, right ear: Secondary | ICD-10-CM | POA: Diagnosis not present

## 2024-02-19 DIAGNOSIS — M26621 Arthralgia of right temporomandibular joint: Secondary | ICD-10-CM | POA: Diagnosis not present

## 2024-02-19 DIAGNOSIS — R0981 Nasal congestion: Secondary | ICD-10-CM | POA: Diagnosis not present

## 2024-03-02 ENCOUNTER — Ambulatory Visit: Payer: Self-pay | Admitting: Internal Medicine

## 2024-03-09 ENCOUNTER — Ambulatory Visit: Payer: Self-pay | Admitting: Internal Medicine

## 2024-03-21 ENCOUNTER — Other Ambulatory Visit: Payer: Self-pay

## 2024-03-21 ENCOUNTER — Emergency Department: Admission: EM | Admit: 2024-03-21 | Discharge: 2024-03-21 | Disposition: A | Discharge status: Home / Self Care

## 2024-03-21 DIAGNOSIS — Z5321 Procedure and treatment not carried out due to patient leaving prior to being seen by health care provider: Secondary | ICD-10-CM | POA: Insufficient documentation

## 2024-03-21 DIAGNOSIS — K625 Hemorrhage of anus and rectum: Secondary | ICD-10-CM | POA: Diagnosis present

## 2024-03-21 LAB — COMPREHENSIVE METABOLIC PANEL WITH GFR
ALT: 17 U/L (ref 0–44)
AST: 19 U/L (ref 15–41)
Albumin: 4.3 g/dL (ref 3.5–5.0)
Alkaline Phosphatase: 52 U/L (ref 38–126)
Anion gap: 11 (ref 5–15)
BUN: 13 mg/dL (ref 8–23)
CO2: 26 mmol/L (ref 22–32)
Calcium: 9.1 mg/dL (ref 8.9–10.3)
Chloride: 106 mmol/L (ref 98–111)
Creatinine, Ser: 0.82 mg/dL (ref 0.44–1.00)
GFR, Estimated: >60 mL/min
Glucose, Bld: 109 mg/dL — ABNORMAL HIGH (ref 70–99)
Potassium: 4.3 mmol/L (ref 3.5–5.1)
Sodium: 143 mmol/L (ref 135–145)
Total Bilirubin: 0.4 mg/dL (ref 0.0–1.2)
Total Protein: 6.9 g/dL (ref 6.5–8.1)

## 2024-03-21 LAB — CBC
HCT: 31.5 % — ABNORMAL LOW (ref 36.0–46.0)
Hemoglobin: 9.7 g/dL — ABNORMAL LOW (ref 12.0–15.0)
MCH: 28.7 pg (ref 26.0–34.0)
MCHC: 30.8 g/dL (ref 30.0–36.0)
MCV: 93.2 fL (ref 80.0–100.0)
Platelets: 317 K/uL (ref 150–400)
RBC: 3.38 MIL/uL — ABNORMAL LOW (ref 3.87–5.11)
RDW: 13.9 % (ref 11.5–15.5)
WBC: 4.8 K/uL (ref 4.0–10.5)
nRBC: 0 % (ref 0.0–0.2)

## 2024-03-21 NOTE — ED Triage Notes (Signed)
 Pt presents to ED from home C/O rectal bleeding since yesterday. Reports 8-9 episodes of raisin-sized dark red clots since yesterday morning. Endorses fatigue, dizziness. Denies SOB.

## 2024-03-22 ENCOUNTER — Ambulatory Visit: Payer: Self-pay | Admitting: Internal Medicine

## 2024-03-22 ENCOUNTER — Telehealth: Payer: Self-pay

## 2024-03-22 ENCOUNTER — Ambulatory Visit: Admitting: Family Medicine

## 2024-03-22 ENCOUNTER — Encounter: Payer: Self-pay | Admitting: Family Medicine

## 2024-03-22 ENCOUNTER — Other Ambulatory Visit: Payer: Self-pay | Admitting: *Deleted

## 2024-03-22 ENCOUNTER — Encounter: Payer: Self-pay | Admitting: Internal Medicine

## 2024-03-22 VITALS — BP 110/60 | HR 108 | Temp 98.0°F | Resp 16 | Ht 61.0 in | Wt 154.6 lb

## 2024-03-22 VITALS — BP 111/75 | HR 98 | Temp 98.7°F | Ht 60.0 in | Wt 155.5 lb

## 2024-03-22 DIAGNOSIS — E782 Mixed hyperlipidemia: Secondary | ICD-10-CM

## 2024-03-22 DIAGNOSIS — D649 Anemia, unspecified: Secondary | ICD-10-CM | POA: Diagnosis not present

## 2024-03-22 DIAGNOSIS — K5909 Other constipation: Secondary | ICD-10-CM | POA: Diagnosis not present

## 2024-03-22 DIAGNOSIS — K625 Hemorrhage of anus and rectum: Secondary | ICD-10-CM

## 2024-03-22 DIAGNOSIS — D500 Iron deficiency anemia secondary to blood loss (chronic): Secondary | ICD-10-CM | POA: Diagnosis not present

## 2024-03-22 DIAGNOSIS — R7301 Impaired fasting glucose: Secondary | ICD-10-CM

## 2024-03-22 DIAGNOSIS — K922 Gastrointestinal hemorrhage, unspecified: Secondary | ICD-10-CM

## 2024-03-22 DIAGNOSIS — I10 Essential (primary) hypertension: Secondary | ICD-10-CM

## 2024-03-22 DIAGNOSIS — K219 Gastro-esophageal reflux disease without esophagitis: Secondary | ICD-10-CM

## 2024-03-22 DIAGNOSIS — M26651 Arthropathy of right temporomandibular joint: Secondary | ICD-10-CM | POA: Diagnosis not present

## 2024-03-22 LAB — POCT GLYCOSYLATED HEMOGLOBIN (HGB A1C): Hemoglobin A1C: 6.1 % — AB (ref 4.0–5.6)

## 2024-03-22 MED ORDER — NA SULFATE-K SULFATE-MG SULF 17.5-3.13-1.6 GM/177ML PO SOLN: 1.0000 | Freq: Once | ORAL | 0 refills | Status: AC

## 2024-03-22 MED ORDER — HYDROCORTISONE (PERIANAL) 2.5 % EX CREA: 1.0000 | TOPICAL_CREAM | Freq: Two times a day (BID) | CUTANEOUS | 1 refills | Status: DC

## 2024-03-22 MED ORDER — PANTOPRAZOLE SODIUM 20 MG PO TBEC: 20.0000 mg | DELAYED_RELEASE_TABLET | Freq: Every day | ORAL | 2 refills | Status: AC

## 2024-03-22 MED ORDER — ATORVASTATIN CALCIUM 20 MG PO TABS: 20.0000 mg | ORAL_TABLET | Freq: Every day | ORAL | 1 refills | Status: AC

## 2024-03-22 MED ORDER — ONDANSETRON 4 MG PO TBDP: 4.0000 mg | ORAL_TABLET | Freq: Three times a day (TID) | ORAL | 0 refills | Status: AC | PRN

## 2024-03-22 MED ORDER — OMEPRAZOLE 20 MG PO CPDR: 20.0000 mg | DELAYED_RELEASE_CAPSULE | Freq: Every day | ORAL | 0 refills | Status: DC

## 2024-03-22 MED ORDER — HYDROCORTISONE ACETATE 25 MG RE SUPP: 25.0000 mg | Freq: Two times a day (BID) | RECTAL | 0 refills | Status: DC

## 2024-03-22 MED ORDER — LINZESS 145 MCG PO CAPS: 145.0000 ug | ORAL_CAPSULE | Freq: Every day | ORAL | 0 refills | Status: AC

## 2024-03-22 NOTE — Telephone Encounter (Signed)
 The patient called reporting that she was seen in the emergency room for rectal bleeding. She also has an urgent referral on file for lower GI bleeding. The patient is scheduled for today at 2:00 PM with NP Robin at the [facility] location.

## 2024-03-22 NOTE — Telephone Encounter (Signed)
 Patient called to say her blood pressure was now 99/69, spoke with dfk and stated if she is feeling bad to go to ER and just to take her blood pressure medicine when it get up to 140s. Patient understood.

## 2024-03-22 NOTE — Progress Notes (Unsigned)
 "   03/23/2024 Shelley Flowers [id number] 1960/10/29  Gastroenterology Office Note    Referring Provider: Fernand Sigrid HERO, MD Primary Care Physician:  Fernand Sigrid HERO, MD  Primary GI Provider: Jinny Carmine, MD    Chief Complaint   Chief Complaint  Patient presents with   Establish Care    Rectal bleeding--on Saturday for 6 hours--found blood clots--patient does have hemorrhoids--no pain      History of Present Illness   Shelley Flowers is a 64 y.o. female presenting today at the request of Fernand Sigrid HERO, MD due to rectal bleeding and anemia.  Discussed the use of AI scribe software for clinical note transcription with the patient, who gave verbal consent to proceed.  Acute onset of rectal bleeding began Saturday while at work, with episodes starting around 11 AM and continuing intermittently until 4 PM. Bleeding was initially noted on toilet paper and later observed as blood mixed with watery stool in the toilet bowl. Initially attributed to eating plums, but subsequently recognized as frank blood. After taking Linzess , further bowel movements occurred with continued bleeding. On Sunday, small, raisin-sized clots were passed. No rectal bleeding occurred on the day of the visit. Denies rectal pain or itching.  Hemoglobin was 9.7 at the hospital. Additional labs for B12, iron , ferritin, and TSH were ordered and drawn; results are pending. Expresses concern about anemia and has not started iron  supplementation. Reports fatigue but denies dizziness or lightheadedness.  History of internal and external hemorrhoids. Patient reports a prior digital rectal exam was performed and that she has a visible external hemorrhoid. Denies rectal pain or itching. No abdominal pain reported.  Bowel movements are infrequent, typically two per week without Linzess . Linzess  is taken approximately once weekly, resulting in complete evacuation and watery stool. Miralax is used occasionally. Daily fruit intake  for fiber and approximately four bottles of water consumed per day. Avoids straining but remains concerned about infrequent bowel movements.  Longstanding acid reflux and heartburn managed with omeprazole  for years, now taken over-the-counter. Continues to experience breakthrough symptoms including frequent burping, gas, occasional vomiting when unable to burp, and burning in the chest. Denies nighttime reflux symptoms. Takes Pepcid  at night and omeprazole  in the morning. Diet includes daily fried, greasy, and spicy foods, two Diet Cokes per day, and no alcohol. Takes Excedrin daily for headaches and does not use NSAIDs.    Past Medical History:  Diagnosis Date   GERD (gastroesophageal reflux disease)    Hypertension    IBS (irritable bowel syndrome)     Past Surgical History:  Procedure Laterality Date   CESAREAN SECTION      No current facility-administered medications for this visit.   Current Outpatient Medications  Medication Sig Dispense Refill   aspirin-acetaminophen -caffeine  (EXCEDRIN MIGRAINE) 250-250-65 MG tablet Take 2 tablets by mouth daily as needed for headache.     atorvastatin  (LIPITOR) 20 MG tablet Take 1 tablet (20 mg total) by mouth daily. 90 tablet 1   cyclobenzaprine  (FLEXERIL ) 10 MG tablet Take 1 tablet (10 mg total) by mouth 3 (three) times daily as needed for muscle spasms. 90 tablet 0   hydrocortisone  (ANUSOL -HC) 2.5 % rectal cream Place 1 Application rectally 2 (two) times daily. 30 g 1   hydrocortisone  (ANUSOL -HC) 25 MG suppository Place 1 suppository (25 mg total) rectally 2 (two) times daily. 12 suppository 0   LINZESS  145 MCG CAPS capsule Take 1 capsule (145 mcg total) by mouth daily. 90 capsule 0   olmesartan -hydrochlorothiazide (BENICAR   HCT) 40-25 MG tablet Take 1 tablet by mouth daily. OFFICE VISIT NEEDED FOR ADDITIONAL REFILLS, CALL OFFICE TO SCHEDULE VISIT 30 tablet 0   ondansetron  (ZOFRAN -ODT) 4 MG disintegrating tablet Take 1 tablet (4 mg total) by  mouth every 8 (eight) hours as needed for nausea or vomiting. 15 tablet 0   pantoprazole  (PROTONIX ) 20 MG tablet Take 1 tablet (20 mg total) by mouth daily. 30 tablet 2   omeprazole  (PRILOSEC ) 20 MG capsule Take 20 mg by mouth daily.     Facility-Administered Medications Ordered in Other Visits  Medication Dose Route Frequency Provider Last Rate Last Admin   acetaminophen -caffeine  (EXCEDRIN TENSION HEADACHE) 500-65 MG per tablet 2 tablet  2 tablet Oral Daily PRN Laurita Cort DASEN, MD       atorvastatin  (LIPITOR) tablet 20 mg  20 mg Oral Daily Zhang, Cort T, MD       bisacodyl  (DULCOLAX) EC tablet 5 mg  5 mg Oral Daily PRN Laurita Cort T, MD       cyclobenzaprine  (FLEXERIL ) tablet 10 mg  10 mg Oral TID PRN Laurita Cort DASEN, MD       [START ON 03/24/2024] ferrous sulfate  tablet 325 mg  325 mg Oral Q breakfast Laurita Cort T, MD       HYDROcodone -acetaminophen  (NORCO/VICODIN) 5-325 MG per tablet 1-2 tablet  1-2 tablet Oral Q4H PRN Laurita Cort DASEN, MD       hydrocortisone  (ANUSOL -HC) 2.5 % rectal cream 1 Application  1 Application Rectal BID Laurita Cort T, MD       linaclotide  (LINZESS ) capsule 145 mcg  145 mcg Oral Daily Laurita Cort T, MD       ondansetron  (ZOFRAN ) tablet 4 mg  4 mg Oral Q6H PRN Laurita Cort T, MD       Or   ondansetron  (ZOFRAN ) injection 4 mg  4 mg Intravenous Q6H PRN Laurita Cort T, MD       pantoprazole  (PROTONIX ) EC tablet 20 mg  20 mg Oral Daily Laurita Cort T, MD       traZODone  (DESYREL ) tablet 25 mg  25 mg Oral QHS PRN Laurita Cort DASEN, MD        Allergies as of 03/22/2024   (No Known Allergies)    No family history on file.  Social History   Socioeconomic History   Marital status: Single    Spouse name: Not on file   Number of children: Not on file   Years of education: Not on file   Highest education level: GED or equivalent  Occupational History   Not on file  Tobacco Use   Smoking status: Never   Smokeless tobacco: Never  Vaping Use   Vaping status: Never Used   Substance and Sexual Activity   Alcohol use: No   Drug use: No   Sexual activity: Not on file  Other Topics Concern   Not on file  Social History Narrative   Not on file   Social Drivers of Health   Tobacco Use: Low Risk (03/23/2024)   Patient History    Smoking Tobacco Use: Never    Smokeless Tobacco Use: Never    Passive Exposure: Not on file  Financial Resource Strain: Medium Risk (06/26/2023)   Overall Financial Resource Strain (CARDIA)    Difficulty of Paying Living Expenses: Somewhat hard  Food Insecurity: No Food Insecurity (06/26/2023)   Hunger Vital Sign    Worried About Running Out of Food in the Last Year: Never true  Ran Out of Food in the Last Year: Never true  Transportation Needs: No Transportation Needs (06/26/2023)   PRAPARE - Administrator, Civil Service (Medical): No    Lack of Transportation (Non-Medical): No  Physical Activity: Unknown (06/26/2023)   Exercise Vital Sign    Days of Exercise per Week: 0 days    Minutes of Exercise per Session: Not on file  Stress: No Stress Concern Present (06/26/2023)   [employer] of Occupational Health - Occupational Stress Questionnaire    Feeling of Stress : Not at all  Social Connections: Moderately Isolated (06/26/2023)   Social Connection and Isolation Panel    Frequency of Communication with Friends and Family: More than three times a week    Frequency of Social Gatherings with Friends and Family: Never    Attends Religious Services: More than 4 times per year    Active Member of Clubs or Organizations: No    Attends Banker Meetings: Not on file    Marital Status: Separated  Intimate Partner Violence: Not At Risk (07/01/2023)   Humiliation, Afraid, Rape, and Kick questionnaire    Fear of Current or Ex-Partner: No    Emotionally Abused: No    Physically Abused: No    Sexually Abused: No  Depression (PHQ2-9): Low Risk (03/22/2024)   Depression (PHQ2-9)    PHQ-2 Score: 0  Alcohol  Screen: Not on file  Housing: High Risk (06/26/2023)   Housing Stability Vital Sign    Unable to Pay for Housing in the Last Year: Yes    Number of Times Moved in the Last Year: 0    Homeless in the Last Year: No  Utilities: Not At Risk (07/01/2023)   [facility] Utilities    Threatened with loss of utilities: No  Health Literacy: Not on file     RELEVANT GI HISTORY, IMAGING AND LABS: CBC    Component Value Date/Time   WBC 6.8 03/23/2024 0708   RBC 2.85 (L) 03/23/2024 0708   HGB 8.4 (L) 03/23/2024 0708   HGB 12.3 07/01/2022 0847   HCT 26.7 (L) 03/23/2024 0708   HCT 38.7 07/01/2022 0847   PLT 281 03/23/2024 0708   PLT 316 07/01/2022 0847   MCV 93.7 03/23/2024 0708   MCV 93 07/01/2022 0847   MCV 90 04/13/2014 1612   MCH 29.5 03/23/2024 0708   MCHC 31.5 03/23/2024 0708   RDW 14.2 03/23/2024 0708   RDW 12.5 07/01/2022 0847   RDW 14.3 04/13/2014 1612   LYMPHSABS 1.2 07/01/2023 2117   LYMPHSABS 1.3 07/01/2022 0847   LYMPHSABS 0.3 (L) 04/13/2014 1612   MONOABS 0.8 07/01/2023 2117   MONOABS 0.4 04/13/2014 1612   EOSABS 0.1 07/01/2023 2117   EOSABS 0.1 07/01/2022 0847   EOSABS 0.0 04/13/2014 1612   BASOSABS 0.0 07/01/2023 2117   BASOSABS 0.0 07/01/2022 0847   BASOSABS 0.0 04/13/2014 1612   Recent Labs    03/25/23 0903 07/01/23 2117 11/15/23 1001 03/21/24 1302 03/23/24 0708  HGB 12.6 12.1 12.7 9.7* 8.4*    CMP     Component Value Date/Time   NA 137 03/23/2024 0708   NA 142 07/01/2022 0847   NA 138 04/13/2014 1612   K 3.8 03/23/2024 0708   K 3.5 04/13/2014 1612   CL 103 03/23/2024 0708   CL 103 04/13/2014 1612   CO2 25 03/23/2024 0708   CO2 29 04/13/2014 1612   GLUCOSE 109 (H) 03/23/2024 0708   GLUCOSE 106 (H) 04/13/2014 1612  BUN 17 03/23/2024 0708   BUN 8 07/01/2022 0847   BUN 11 04/13/2014 1612   CREATININE 0.68 03/23/2024 0708   CREATININE 0.73 04/13/2014 1612   CALCIUM  9.2 03/23/2024 0708   CALCIUM  9.4 04/13/2014 1612   PROT 6.6 03/23/2024 0708   PROT 7.4  07/01/2022 0847   PROT 8.4 (H) 04/13/2014 1612   ALBUMIN 4.2 03/23/2024 0708   ALBUMIN 4.6 07/01/2022 0847   ALBUMIN 3.9 04/13/2014 1612   AST 21 03/23/2024 0708   AST 32 04/13/2014 1612   ALT 18 03/23/2024 0708   ALT 36 04/13/2014 1612   ALKPHOS 45 03/23/2024 0708   ALKPHOS 51 04/13/2014 1612   BILITOT 0.4 03/23/2024 0708   BILITOT 0.5 07/01/2022 0847   BILITOT 0.7 04/13/2014 1612   GFRNONAA >60 03/23/2024 0708   GFRNONAA >60 04/13/2014 1612   GFRNONAA >60 06/19/2013 2146   GFRAA >60 04/19/2019 2123   GFRAA >60 04/13/2014 1612   GFRAA >60 06/19/2013 2146      Latest Ref Rng & Units 03/23/2024    7:08 AM 03/21/2024    1:02 PM 07/01/2023   10:00 PM  Hepatic Function  Total Protein 6.5 - 8.1 g/dL 6.6  6.9  7.9   Albumin 3.5 - 5.0 g/dL 4.2  4.3  4.3   AST 15 - 41 U/L 21  19  28    ALT 0 - 44 U/L 18  17  27    Alk Phosphatase 38 - 126 U/L 45  52  47   Total Bilirubin 0.0 - 1.2 mg/dL 0.4  0.4  1.1       Review of Systems   All systems reviewed and negative except where noted in HPI.    Physical Exam  BP 111/75   Pulse 98   Temp 98.7 F (37.1 C) (Oral)   Ht 5' (1.524 m)   Wt 155 lb 8 oz (70.5 kg)   LMP 12/12/2010   SpO2 98%   BMI 30.37 kg/m  Patient's last menstrual period was 12/12/2010. General:   Alert and oriented. Pleasant and cooperative. Well-nourished and well-developed. NAD. Head:  Normocephalic and atraumatic. Eyes:  Without icterus Ears:  Normal auditory acuity. Lungs:  Respirations even and unlabored.  Clear throughout to auscultation.   No wheezes, crackles, or rhonchi. No acute distress. Heart:  Regular rate and rhythm; no murmurs, clicks, rubs, or gallops. Abdomen:  Normal bowel sounds.  No bruits.  Soft, non-tender and non-distended without masses, hepatosplenomegaly or hernias noted.  No guarding or rebound tenderness. Rectal:  Deferred. Msk:  Symmetrical without gross deformities. Normal posture. Extremities:  Without edema. Neurologic:  Alert and   oriented x4;  grossly normal neurologically. Skin:  Intact without significant lesions or rashes. Psych:  Alert and cooperative. Normal mood and affect.   Assessment & Plan   Shelley Flowers is a 64 y.o. female presenting today with anemia and rectal bleeding.   Rectal bleeding.  Chronic constipation. suspected hemorrhoids.  - Recommended increased fiber intake through diet and/or supplements. Advised consistent use of Miralax, starting with a half to full capful daily or every other day. - Encouraged continued hydration. - Increase frequency of Linzess  instead of just once weekly.  - Prescribed hydrocortisone  suppositories twice daily for suspected internal hemorrhoids. - Advised to avoid straining and maintain regular, soft bowel movements. - Proceed with colonoscopy in near future: the risks, benefits, and alternatives have been discussed with the patient in detail, which include, but are not limited to: bleeding, infection,  perforation & drug reaction. The patient states understanding and desires to proceed.  Anemia, Iron  deficiency suspected.  Low hemoglobin with fatigue and iron  studies pending.  - will monitor iron  studies that were drawn today.  - Discussed possible referral to hematology for iron  infusion if iron  deficiency is confirmed and levels are significantly low. - proceed with endoscopies to evaluate for upper and lower GI bleed.   GERD with breakthrough symptoms Chronic GERD with persistent symptoms despite omeprazole , likely exacerbated by dietary factors and NSAID use.  - Provided dietary counseling to reduce sodas, spicy foods, and fried foods, and to avoid eating within three hours of bedtime. - stop omeprazole . Start pantoprazole  20 mg once dialy 30 prior to meals - Can continue OTC Pepcid  at night as needed. - Schedule EGD in the near future. I discussed risks of EGD with patient today, including risk of sedation, bleeding or perforation. Patient provides  understanding and gave verbal consent to proceed.   Follow up in 2 months  Grayce Bohr, DNP, AGNP-C [facility]  "

## 2024-03-22 NOTE — Telephone Encounter (Signed)
Left patient a message to give office a callback.Marland Kitchen

## 2024-03-22 NOTE — Progress Notes (Signed)
 [facility] [address] [employer], [place] [address]  Internal MEDICINE  Office Visit Note  Patient Name: Shelley Flowers  [id number]  [id number]  Date of Service: 03/22/2024   Complaints/HPI Pt is here for establishment of PCP. Chief Complaint  Patient presents with   New Patient (Initial Visit)   Hypertension   Gastroesophageal Reflux   Quality Metric Gaps    Mammogram and Colonoscopy needed   Rectal Bleeding    Went to ED yesterday for this, but left due to long wait time. Lab panels were done, results in chart.   Prediabetes   HPI Patient is seen for establishment of PCP  She has locked jaw on the right side for about a month now denies any trauma has been having problem with chewing,  has not seen a dentist for a while, denies any earache She also went to ED yesterday with an episode of rectal bleeding, however was unable to be seen due to long wait She has been having problems with rectal pain for for almost a year now, she attributes this to having hemorrhoids Patient is also complaining for heartburn which is not relieved by taking omeprazole  she also suffers from nausea at least 2-3 times per month She does have change in her BM occasional constipation is also present which is relieved by taking Linzess  once or 2 times a week Labs reviewed from ED yesterday has a hemoglobin of 9.7 which is a drop from 12.74 months ago She is complaining of being tired, blood pressure was also found to be low  Current Medication: Outpatient Encounter Medications as of 03/22/2024  Medication Sig   aspirin-acetaminophen -caffeine  (EXCEDRIN MIGRAINE) 250-250-65 MG tablet Take 2 tablets by mouth daily as needed for headache.   cyclobenzaprine  (FLEXERIL ) 10 MG tablet Take 1 tablet (10 mg total) by mouth 3 (three) times daily as needed for muscle spasms.   olmesartan -hydrochlorothiazide (BENICAR  HCT) 40-25 MG tablet Take 1 tablet by mouth daily. OFFICE VISIT NEEDED FOR ADDITIONAL  REFILLS, CALL OFFICE TO SCHEDULE VISIT   [DISCONTINUED] amoxicillin -clavulanate (AUGMENTIN ) 875-125 MG tablet Take 1 tablet by mouth 2 (two) times daily.   [DISCONTINUED] atorvastatin  (LIPITOR) 20 MG tablet Take 20 mg by mouth daily.   [DISCONTINUED] HYDROcodone -acetaminophen  (NORCO/VICODIN) 5-325 MG tablet Take 1 tablet by mouth every 6 (six) hours as needed for moderate pain (pain score 4-6).   [DISCONTINUED] LINZESS  145 MCG CAPS capsule TAKE 1 CAPSULE BY MOUTH EVERY DAY   [DISCONTINUED] omeprazole  (PRILOSEC ) 20 MG capsule TAKE 1 CAPSULE BY MOUTH EVERY DAY   [DISCONTINUED] ondansetron  (ZOFRAN -ODT) 4 MG disintegrating tablet Take 1 tablet (4 mg total) by mouth every 8 (eight) hours as needed for nausea or vomiting.   atorvastatin  (LIPITOR) 20 MG tablet Take 1 tablet (20 mg total) by mouth daily.   LINZESS  145 MCG CAPS capsule Take 1 capsule (145 mcg total) by mouth daily.   omeprazole  (PRILOSEC ) 20 MG capsule Take 1 capsule (20 mg total) by mouth daily.   ondansetron  (ZOFRAN -ODT) 4 MG disintegrating tablet Take 1 tablet (4 mg total) by mouth every 8 (eight) hours as needed for nausea or vomiting.   No facility-administered encounter medications on file as of 03/22/2024.    Surgical History: Past Surgical History:  Procedure Laterality Date   CESAREAN SECTION      Medical History: Past Medical History:  Diagnosis Date   GERD (gastroesophageal reflux disease)    Hypertension    IBS (irritable bowel syndrome)     Family History:  History reviewed. No pertinent family history.  Social History   Socioeconomic History   Marital status: Single    Spouse name: Not on file   Number of children: Not on file   Years of education: Not on file   Highest education level: GED or equivalent  Occupational History   Not on file  Tobacco Use   Smoking status: Never   Smokeless tobacco: Never  Vaping Use   Vaping status: Never Used  Substance and Sexual Activity   Alcohol use: No   Drug  use: No   Sexual activity: Not on file  Other Topics Concern   Not on file  Social History Narrative   Not on file   Social Drivers of Health   Tobacco Use: Low Risk (03/22/2024)   Patient History    Smoking Tobacco Use: Never    Smokeless Tobacco Use: Never    Passive Exposure: Not on file  Financial Resource Strain: Medium Risk (06/26/2023)   Overall Financial Resource Strain (CARDIA)    Difficulty of Paying Living Expenses: Somewhat hard  Food Insecurity: No Food Insecurity (06/26/2023)   Hunger Vital Sign    Worried About Running Out of Food in the Last Year: Never true    Ran Out of Food in the Last Year: Never true  Transportation Needs: No Transportation Needs (06/26/2023)   PRAPARE - Administrator, Civil Service (Medical): No    Lack of Transportation (Non-Medical): No  Physical Activity: Unknown (06/26/2023)   Exercise Vital Sign    Days of Exercise per Week: 0 days    Minutes of Exercise per Session: Not on file  Stress: No Stress Concern Present (06/26/2023)   [employer] of Occupational Health - Occupational Stress Questionnaire    Feeling of Stress : Not at all  Social Connections: Moderately Isolated (06/26/2023)   Social Connection and Isolation Panel    Frequency of Communication with Friends and Family: More than three times a week    Frequency of Social Gatherings with Friends and Family: Never    Attends Religious Services: More than 4 times per year    Active Member of [employer] or Organizations: No    Attends Engineer, Structural: Not on file    Marital Status: Separated  Intimate Partner Violence: Not At Risk (07/01/2023)   Humiliation, Afraid, Rape, and Kick questionnaire    Fear of Current or Ex-Partner: No    Emotionally Abused: No    Physically Abused: No    Sexually Abused: No  Depression (PHQ2-9): Low Risk (03/22/2024)   Depression (PHQ2-9)    PHQ-2 Score: 0  Alcohol Screen: Not on file  Housing: High Risk (06/26/2023)    Housing Stability Vital Sign    Unable to Pay for Housing in the Last Year: Yes    Number of Times Moved in the Last Year: 0    Homeless in the Last Year: No  Utilities: Not At Risk (07/01/2023)   [facility] Utilities    Threatened with loss of utilities: No  Health Literacy: Not on file     Review of Systems  Constitutional:  Negative for chills, fatigue and unexpected weight change.  HENT:  Positive for postnasal drip. Negative for congestion, rhinorrhea, sneezing and sore throat.   Eyes:  Negative for redness.  Respiratory:  Negative for cough, chest tightness and shortness of breath.   Cardiovascular:  Negative for chest pain and palpitations.  Gastrointestinal:  Positive for abdominal pain, anal bleeding, constipation and  nausea. Negative for diarrhea and vomiting.  Genitourinary:  Negative for dysuria and frequency.  Musculoskeletal:  Negative for arthralgias, back pain, joint swelling and neck pain.  Skin:  Negative for rash.  Neurological: Negative.  Negative for tremors and numbness.  Hematological:  Negative for adenopathy. Does not bruise/bleed easily.  Psychiatric/Behavioral:  Negative for behavioral problems (Depression), sleep disturbance and suicidal ideas. The patient is not nervous/anxious.     Vital Signs: BP 110/60   Pulse (!) 108   Temp 98 F (36.7 C)   Resp 16   Ht 5' 1 (1.549 m)   Wt 154 lb 9.6 oz (70.1 kg)   LMP 12/12/2010   SpO2 99%   BMI 29.21 kg/m    Physical Exam Constitutional:      Appearance: Normal appearance.  HENT:     Head: Normocephalic and atraumatic.     Right Ear: Tympanic membrane normal.     Nose: Nose normal.     Mouth/Throat:     Mouth: Mucous membranes are moist.     Pharynx: No posterior oropharyngeal erythema.  Eyes:     Extraocular Movements: Extraocular movements intact.     Pupils: Pupils are equal, round, and reactive to light.  Cardiovascular:     Pulses: Normal pulses.     Heart sounds: Normal heart sounds.   Pulmonary:     Effort: Pulmonary effort is normal.     Breath sounds: Normal breath sounds.  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     Mental Status: She is alert.  Psychiatric:        Mood and Affect: Mood normal.        Behavior: Behavior normal.       Assessment/Plan: 1. Lower GI bleeding This is going on almost a year now, patient needs to have colonoscopy as soon as possible will continue all other medication as prescribed by the previous PCP - ondansetron  (ZOFRAN -ODT) 4 MG disintegrating tablet; Take 1 tablet (4 mg total) by mouth every 8 (eight) hours as needed for nausea or vomiting.  Dispense: 15 tablet; Refill: 0 - omeprazole  (PRILOSEC ) 20 MG capsule; Take 1 capsule (20 mg total) by mouth daily.  Dispense: 90 capsule; Refill: 0 - LINZESS  145 MCG CAPS capsule; Take 1 capsule (145 mcg total) by mouth daily.  Dispense: 90 capsule; Refill: 0 - Ambulatory referral to Gastroenterology - Iron , TIBC and Ferritin Panel - B12 and Folate Panel  2. Gastro-esophageal reflux disease without esophagitis Worsening reflux with nausea patient may need ultrasound of the abdomen to look for gallstones/cholelithiasis - ondansetron  (ZOFRAN -ODT) 4 MG disintegrating tablet; Take 1 tablet (4 mg total) by mouth every 8 (eight) hours as needed for nausea or vomiting.  Dispense: 15 tablet; Refill: 0 - omeprazole  (PRILOSEC ) 20 MG capsule; Take 1 capsule (20 mg total) by mouth daily.  Dispense: 90 capsule; Refill: 0 - LINZESS  145 MCG CAPS capsule; Take 1 capsule (145 mcg total) by mouth daily.  Dispense: 90 capsule; Refill: 0  3. Benign hypertension Patient blood pressure is slightly on the low side we will decrease her olmesartan  HCTZ to half tablet a day  4. Anemia due to blood loss, chronic Hemoglobin has dropped from 12.7-9.7 in the past 4 months patient is to have a EGD done along with colonoscopy will get iron  studies in the  meantime - Iron , TIBC and Ferritin Panel - B12 and Folate Panel  5. 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 Mixed hyperlipidemia Will continue on Lipitor as before - atorvastatin  (LIPITOR) 20 MG tablet; Take 1 tablet (20 mg total) by mouth daily.  Dispense: 90 tablet; Refill: 1   General Counseling: Shelley Flowers verbalizes understanding of the findings of todays visit and agrees with plan of treatment. I have discussed any further diagnostic evaluation that may be needed or ordered today. We also reviewed her medications today. she has been encouraged to call the office with any questions or concerns that should arise related to todays visit.    Counseling:   Controlled Substance Database was reviewed by me.  Orders Placed This Encounter  Procedures   Iron , TIBC and Ferritin Panel   TSH + free T4   B12 and Folate Panel   Ambulatory referral to Gastroenterology   POCT HgB A1C    Meds ordered this encounter  Medications   ondansetron  (ZOFRAN -ODT) 4 MG disintegrating tablet    Sig: Take 1 tablet (4 mg total) by mouth every 8 (eight) hours as needed for nausea or vomiting.    Dispense:  15 tablet    Refill:  0   omeprazole  (PRILOSEC ) 20 MG capsule    Sig: Take 1 capsule (20 mg total) by mouth daily.    Dispense:  90 capsule    Refill:  0   atorvastatin  (LIPITOR) 20 MG tablet    Sig: Take 1 tablet (20 mg total) by mouth daily.    Dispense:  90 tablet    Refill:  1   LINZESS  145 MCG CAPS capsule    Sig: Take 1 capsule (145 mcg total) by mouth daily.    Dispense:  90 capsule    Refill:  0    Time spent:45 Minutes

## 2024-03-23 ENCOUNTER — Observation Stay
Admission: EM | Admit: 2024-03-23 | Discharge: 2024-03-25 | Disposition: A | Discharge status: Home / Self Care | Attending: Internal Medicine | Admitting: Internal Medicine

## 2024-03-23 ENCOUNTER — Other Ambulatory Visit: Payer: Self-pay

## 2024-03-23 DIAGNOSIS — K625 Hemorrhage of anus and rectum: Secondary | ICD-10-CM | POA: Diagnosis not present

## 2024-03-23 DIAGNOSIS — K589 Irritable bowel syndrome without diarrhea: Secondary | ICD-10-CM | POA: Insufficient documentation

## 2024-03-23 DIAGNOSIS — E66811 Obesity, class 1: Secondary | ICD-10-CM | POA: Diagnosis not present

## 2024-03-23 DIAGNOSIS — K922 Gastrointestinal hemorrhage, unspecified: Secondary | ICD-10-CM | POA: Diagnosis present

## 2024-03-23 DIAGNOSIS — E785 Hyperlipidemia, unspecified: Secondary | ICD-10-CM | POA: Insufficient documentation

## 2024-03-23 DIAGNOSIS — D62 Acute posthemorrhagic anemia: Principal | ICD-10-CM | POA: Insufficient documentation

## 2024-03-23 DIAGNOSIS — K219 Gastro-esophageal reflux disease without esophagitis: Secondary | ICD-10-CM

## 2024-03-23 DIAGNOSIS — D649 Anemia, unspecified: Secondary | ICD-10-CM

## 2024-03-23 DIAGNOSIS — Z79899 Other long term (current) drug therapy: Secondary | ICD-10-CM | POA: Insufficient documentation

## 2024-03-23 DIAGNOSIS — R519 Headache, unspecified: Secondary | ICD-10-CM | POA: Insufficient documentation

## 2024-03-23 DIAGNOSIS — Z6830 Body mass index (BMI) 30.0-30.9, adult: Secondary | ICD-10-CM | POA: Insufficient documentation

## 2024-03-23 DIAGNOSIS — I10 Essential (primary) hypertension: Secondary | ICD-10-CM | POA: Diagnosis not present

## 2024-03-23 DIAGNOSIS — G8929 Other chronic pain: Secondary | ICD-10-CM

## 2024-03-23 DIAGNOSIS — E782 Mixed hyperlipidemia: Secondary | ICD-10-CM

## 2024-03-23 DIAGNOSIS — M62838 Other muscle spasm: Secondary | ICD-10-CM

## 2024-03-23 DIAGNOSIS — E669 Obesity, unspecified: Secondary | ICD-10-CM

## 2024-03-23 LAB — COMPREHENSIVE METABOLIC PANEL WITH GFR
ALT: 18 U/L (ref 0–44)
AST: 21 U/L (ref 15–41)
Albumin: 4.2 g/dL (ref 3.5–5.0)
Alkaline Phosphatase: 45 U/L (ref 38–126)
Anion gap: 9 (ref 5–15)
BUN: 17 mg/dL (ref 8–23)
CO2: 25 mmol/L (ref 22–32)
Calcium: 9.2 mg/dL (ref 8.9–10.3)
Chloride: 103 mmol/L (ref 98–111)
Creatinine, Ser: 0.68 mg/dL (ref 0.44–1.00)
GFR, Estimated: >60 mL/min
Glucose, Bld: 109 mg/dL — ABNORMAL HIGH (ref 70–99)
Potassium: 3.8 mmol/L (ref 3.5–5.1)
Sodium: 137 mmol/L (ref 135–145)
Total Bilirubin: 0.4 mg/dL (ref 0.0–1.2)
Total Protein: 6.6 g/dL (ref 6.5–8.1)

## 2024-03-23 LAB — HEMOGLOBIN AND HEMATOCRIT, BLOOD
HCT: 22.6 % — ABNORMAL LOW (ref 36.0–46.0)
HCT: 23.8 % — ABNORMAL LOW (ref 36.0–46.0)
Hemoglobin: 7.1 g/dL — ABNORMAL LOW (ref 12.0–15.0)
Hemoglobin: 7.5 g/dL — ABNORMAL LOW (ref 12.0–15.0)

## 2024-03-23 LAB — TYPE AND SCREEN
ABO/RH(D): A POS
Antibody Screen: NEGATIVE

## 2024-03-23 LAB — IRON,TIBC AND FERRITIN PANEL
Ferritin: 22 ng/mL (ref 15–150)
Iron Saturation: 12 % — ABNORMAL LOW (ref 15–55)
Iron: 42 ug/dL (ref 27–139)
Total Iron Binding Capacity: 346 ug/dL (ref 250–450)
UIBC: 304 ug/dL (ref 118–369)

## 2024-03-23 LAB — CBC
HCT: 26.7 % — ABNORMAL LOW (ref 36.0–46.0)
Hemoglobin: 8.4 g/dL — ABNORMAL LOW (ref 12.0–15.0)
MCH: 29.5 pg (ref 26.0–34.0)
MCHC: 31.5 g/dL (ref 30.0–36.0)
MCV: 93.7 fL (ref 80.0–100.0)
Platelets: 281 K/uL (ref 150–400)
RBC: 2.85 MIL/uL — ABNORMAL LOW (ref 3.87–5.11)
RDW: 14.2 % (ref 11.5–15.5)
WBC: 6.8 K/uL (ref 4.0–10.5)
nRBC: 0 % (ref 0.0–0.2)

## 2024-03-23 LAB — TSH+FREE T4
Free T4: 0.89 ng/dL (ref 0.82–1.77)
TSH: 1.23 u[IU]/mL (ref 0.450–4.500)

## 2024-03-23 LAB — B12 AND FOLATE PANEL
Folate: 16.3 ng/mL
Vitamin B-12: 407 pg/mL (ref 232–1245)

## 2024-03-23 MED ORDER — SODIUM CHLORIDE 0.9 % IV SOLN: INTRAVENOUS | Status: DC

## 2024-03-23 MED ORDER — BISACODYL 5 MG PO TBEC: 5.0000 mg | DELAYED_RELEASE_TABLET | Freq: Every day | ORAL | Status: DC | PRN

## 2024-03-23 MED ORDER — CYCLOBENZAPRINE HCL 10 MG PO TABS: 10.0000 mg | ORAL_TABLET | Freq: Three times a day (TID) | ORAL | Status: DC | PRN

## 2024-03-23 MED ORDER — ATORVASTATIN CALCIUM 20 MG PO TABS
20.0000 mg | ORAL_TABLET | Freq: Every day | ORAL | Status: DC
  Administered 2024-03-24 – 2024-03-25 (×2): 20 mg via ORAL
  Filled 2024-03-23 (×2): qty 1

## 2024-03-23 MED ORDER — ONDANSETRON HCL 4 MG PO TABS: 4.0000 mg | ORAL_TABLET | Freq: Four times a day (QID) | ORAL | Status: DC | PRN

## 2024-03-23 MED ORDER — FERROUS SULFATE 325 (65 FE) MG PO TABS
325.0000 mg | ORAL_TABLET | Freq: Every day | ORAL | Status: DC
  Administered 2024-03-24: 325 mg via ORAL
  Filled 2024-03-23: qty 1

## 2024-03-23 MED ORDER — PANTOPRAZOLE SODIUM 20 MG PO TBEC
20.0000 mg | DELAYED_RELEASE_TABLET | Freq: Every day | ORAL | Status: DC
  Administered 2024-03-24 – 2024-03-25 (×2): 20 mg via ORAL
  Filled 2024-03-23 (×2): qty 1

## 2024-03-23 MED ORDER — LINACLOTIDE 145 MCG PO CAPS
145.0000 ug | ORAL_CAPSULE | Freq: Every day | ORAL | Status: DC
  Filled 2024-03-23: qty 1

## 2024-03-23 MED ORDER — HYDROCODONE-ACETAMINOPHEN 5-325 MG PO TABS
1.0000 | ORAL_TABLET | ORAL | Status: DC | PRN
  Filled 2024-03-23: qty 2

## 2024-03-23 MED ORDER — HYDROCORTISONE (PERIANAL) 2.5 % EX CREA
1.0000 | TOPICAL_CREAM | Freq: Two times a day (BID) | CUTANEOUS | Status: DC
  Administered 2024-03-23 – 2024-03-24 (×2): 1 via RECTAL
  Filled 2024-03-23: qty 28.35

## 2024-03-23 MED ORDER — SODIUM CHLORIDE 0.9 % IV BOLUS
1000.0000 mL | Freq: Once | INTRAVENOUS | Status: AC
  Administered 2024-03-23: 1000 mL via INTRAVENOUS

## 2024-03-23 MED ORDER — ACETAMINOPHEN-CAFFEINE 500-65 MG PO TABS
2.0000 | ORAL_TABLET | Freq: Every day | ORAL | Status: DC | PRN
  Filled 2024-03-23: qty 2

## 2024-03-23 MED ORDER — ONDANSETRON HCL 4 MG/2ML IJ SOLN
4.0000 mg | Freq: Four times a day (QID) | INTRAMUSCULAR | Status: DC | PRN
  Administered 2024-03-23: 4 mg via INTRAVENOUS
  Filled 2024-03-23: qty 2

## 2024-03-23 MED ORDER — NA SULFATE-K SULFATE-MG SULF 17.5-3.13-1.6 GM/177ML PO SOLN
1.0000 | Freq: Once | ORAL | Status: AC
  Administered 2024-03-23: 354 mL via ORAL
  Filled 2024-03-23: qty 1

## 2024-03-23 MED ORDER — TRAZODONE HCL 50 MG PO TABS: 25.0000 mg | ORAL_TABLET | Freq: Every evening | ORAL | Status: DC | PRN

## 2024-03-23 NOTE — ED Triage Notes (Signed)
 Pt presents to the ED via POV from home. Pt reports rectal bleeding since saturday. Was seen here on 1/11 and left due to the wait. Pt reports seeing PCP who sent her to [facility] for more blood work, but has not heard back. Pt reports that the blood is filling the toilet bowl, but she is unable to describe the color. Hgb from 1/11 was 9.7

## 2024-03-23 NOTE — ED Provider Notes (Signed)
 "  [facility] Provider Note   Event Date/Time   First MD Initiated Contact with Patient 03/23/24 0725     (approximate) History  Rectal Bleeding  HPI Shelley Flowers is a 64 y.o. female with stated past medical history of internal/external hemorrhoids who presents complaining of heavy rectal bleeding that has been present over the last 4 days.  Patient states that she was seen yesterday and left due to the wait time.  Her hemoglobin at that time was 9.7.  Patient states that throughout the day today she has had 4 more episodes of a large amount of bright red blood as well as having new symptoms of dyspnea on exertion and orthostatic lightheadedness.  Patient denies any blood thinner use ROS: Patient currently denies any vision changes, tinnitus, difficulty speaking, facial droop, sore throat, chest pain, shortness of breath, abdominal pain, nausea/vomiting/diarrhea, dysuria, or weakness/numbness/paresthesias in any extremity   Physical Exam  Triage Vital Signs: ED Triage Vitals  Encounter Vitals Group     BP 03/23/24 0705 129/68     Girls Systolic BP Percentile --      Girls Diastolic BP Percentile --      Boys Systolic BP Percentile --      Boys Diastolic BP Percentile --      Pulse Rate 03/23/24 0705 (!) 110     Resp 03/23/24 0705 18     Temp 03/23/24 0705 98.5 F (36.9 C)     Temp Source 03/23/24 0705 Oral     SpO2 03/23/24 0705 100 %     Weight 03/23/24 0706 154 lb 5.2 oz (70 kg)     Height 03/23/24 0706 5' (1.524 m)     Head Circumference --      Peak Flow --      Pain Score 03/23/24 0705 0     Pain Loc --      Pain Education --      Exclude from Growth Chart --    Most recent vital signs: Vitals:   03/23/24 1200 03/23/24 1430  BP: 119/75 (!) 110/58  Pulse: 86 81  Resp: 19 19  Temp:    SpO2: 100% 100%   General: Awake, oriented x4. CV:  Good peripheral perfusion. Resp:  Normal effort. Abd:  No distention. Other:  Middle-aged obese  African-American female resting comfortably in no acute distress ED Results / Procedures / Treatments  Labs (all labs ordered are listed, but only abnormal results are displayed) Labs Reviewed  COMPREHENSIVE METABOLIC PANEL WITH GFR - Abnormal; Notable for the following components:      Result Value   Glucose, Bld 109 (*)    All other components within normal limits  CBC - Abnormal; Notable for the following components:   RBC 2.85 (*)    Hemoglobin 8.4 (*)    HCT 26.7 (*)    All other components within normal limits  HEMOGLOBIN AND HEMATOCRIT, BLOOD  HEMOGLOBIN AND HEMATOCRIT, BLOOD  HIV ANTIBODY (ROUTINE TESTING W REFLEX)  POC OCCULT BLOOD, ED  TYPE AND SCREEN   PROCEDURES: Critical Care performed: No Procedures MEDICATIONS ORDERED IN ED: Medications  acetaminophen -caffeine  (EXCEDRIN TENSION HEADACHE) 500-65 MG per tablet 2 tablet (has no administration in time range)  atorvastatin  (LIPITOR) tablet 20 mg (20 mg Oral Not Given 03/23/24 1011)  linaclotide  (LINZESS ) capsule 145 mcg (145 mcg Oral Not Given 03/23/24 1011)  pantoprazole  (PROTONIX ) EC tablet 20 mg (20 mg Oral Not Given 03/23/24 1012)  cyclobenzaprine  (FLEXERIL ) tablet 10  mg (has no administration in time range)  hydrocortisone  (ANUSOL -HC) 2.5 % rectal cream 1 Application (1 Application Rectal Not Given 03/23/24 1012)  HYDROcodone -acetaminophen  (NORCO/VICODIN) 5-325 MG per tablet 1-2 tablet (has no administration in time range)  ondansetron  (ZOFRAN ) tablet 4 mg (has no administration in time range)    Or  ondansetron  (ZOFRAN ) injection 4 mg (has no administration in time range)  bisacodyl  (DULCOLAX) EC tablet 5 mg (has no administration in time range)  traZODone  (DESYREL ) tablet 25 mg (has no administration in time range)  ferrous sulfate  tablet 325 mg (has no administration in time range)  0.9 %  sodium chloride  infusion (0 mLs Intravenous Hold 03/23/24 1521)  Na Sulfate-K Sulfate-Mg Sulfate concentrate (SUPREP) kit 354  mL (has no administration in time range)  sodium chloride  0.9 % bolus 1,000 mL (1,000 mLs Intravenous New Bag/Given 03/23/24 1153)   IMPRESSION / MDM / ASSESSMENT AND PLAN / ED COURSE  I reviewed the triage vital signs and the nursing notes.                             The patient is on the cardiac monitor to evaluate for evidence of arrhythmia and/or significant heart rate changes. Patient's presentation is most consistent with acute presentation with potential threat to life or bodily function. Patient is a 64 year old female with the above-stated past medical history that presents for bright red blood per rectum that has been present over the last 4 days. DDx: Lower GI bleeding, brisk upper GI bleeding, diverticulitis, AVM rupture Plan: CBC, CMP, type and screen  Patient's hemoglobin has significantly decreased from 9.7-8.4 over the last 2 days.  I have spoken to Dr. Jinny in gastroenterology who agrees with plan for admission to the internal medicine service given this precipitous drop in hemoglobin for urgent colonoscopy.  Dispo: Admit to medicine with GI   FINAL CLINICAL IMPRESSION(S) / ED DIAGNOSES   Final diagnoses:  Gastrointestinal hemorrhage, unspecified gastrointestinal hemorrhage type  Symptomatic anemia   Rx / DC Orders   ED Discharge Orders     None      Note:  This document was prepared using Dragon voice recognition software and may include unintentional dictation errors.   Aubert Choyce K, MD 03/23/24 1530  "

## 2024-03-23 NOTE — ED Notes (Signed)
 Pt ambulated to BR with steady gait. On return from BR, HR noted to be 133 on ED cardiac monitor. After a few minutes, HR returned to WNL. Pt endorses dizziness with ambulation. Hospitalist made aware.

## 2024-03-23 NOTE — Consult Note (Signed)
 "      Rogelia Copping, MD [facility]  [address], [address] [place], [place] [address] Phone: [phone] Fax : [phone]  Consultation  Referring Provider:     Dr. Kemper Primary Care Physician:  Fernand Sigrid HERO, MD Primary Gastroenterologist: [facility]         Reason for Consultation:     Rectal bleeding  Date of Admission:  03/23/2024 Date of Consultation:  03/23/2024   HPI:   FREDDYE CARDAMONE is a 64 y.o. female who was seen at [facility] by Grayce Bohr yesterday for rectal bleeding.  The patient had reported rectal bleeding that had started a few days prior and lasted 6 hours with blood clots.  The bleeding had subsequently stopped and was initially noted on the toilet paper and then observed as blood mixed with the water in the toilet bowel.  The patient's reported history of external hemorrhoids.  The patient was recommended to undergo a upper endoscopy and colonoscopy due to the rectal bleeding chronic anemia and a history of heartburn.  The patient also has a history of chronic constipation and was told to take MiraLAX in addition to be given hydrocortisone  suppositories for the hemorrhoids. The patient then presented to the emergency department today with rectal bleeding and a hemoglobin that had dropped.  Patient's most recent hemoglobin hematocrit have shown:  Component     Latest Ref Rng 07/01/2023 11/15/2023 03/21/2024 03/23/2024  Hemoglobin     12.0 - 15.0 g/dL 87.8  87.2  9.7 (L)  8.4 (L)   HCT     36.0 - 46.0 % 38.2  39.6  31.5 (L)  26.7 (L)    The patient reports that she did not have any bleeding after being in the ED on Sunday.  She states that she was in the waiting room for so long that she decided to go home but was then seen at  GI as described above but then started bleeding again last night with maroon stools and clots and she denies any black stools.  The patient does take NSAIDs on a daily basis. There is no report of any family history of colon cancer or  colon polyps of the patient denies ever having a colonoscopy in the past.  The patient was set up for an EGD and colonoscopy for April but could not wait for that due to her recurrent bleeding now.  There is no report of any unexplained weight loss.  Past Medical History:  Diagnosis Date   GERD (gastroesophageal reflux disease)    Hypertension    IBS (irritable bowel syndrome)     Past Surgical History:  Procedure Laterality Date   CESAREAN SECTION      Prior to Admission medications  Medication Sig Start Date End Date Taking? Authorizing Provider  aspirin-acetaminophen -caffeine  (EXCEDRIN MIGRAINE) 250-250-65 MG tablet Take 2 tablets by mouth daily as needed for headache.    [provider]  atorvastatin  (LIPITOR) 20 MG tablet Take 1 tablet (20 mg total) by mouth daily. 03/22/24   Khan, Fozia M, MD  cyclobenzaprine  (FLEXERIL ) 10 MG tablet Take 1 tablet (10 mg total) by mouth 3 (three) times daily as needed for muscle spasms. 06/02/23   Manya Toribio SQUIBB, PA  hydrocortisone  (ANUSOL -HC) 2.5 % rectal cream Place 1 Application rectally 2 (two) times daily. 03/22/24   Bohr Grayce, NP  hydrocortisone  (ANUSOL -HC) 25 MG suppository Place 1 suppository (25 mg total) rectally 2 (two) times daily. 03/22/24   Bohr Grayce, NP  LINZESS  145 MCG CAPS capsule Take 1 capsule (145 mcg total) by mouth daily. 03/22/24   Khan, Fozia M, MD  olmesartan -hydrochlorothiazide (BENICAR  HCT) 40-25 MG tablet Take 1 tablet by mouth daily. OFFICE VISIT NEEDED FOR ADDITIONAL REFILLS, CALL OFFICE TO SCHEDULE VISIT 11/10/23   Manya Toribio SQUIBB, PA  ondansetron  (ZOFRAN -ODT) 4 MG disintegrating tablet Take 1 tablet (4 mg total) by mouth every 8 (eight) hours as needed for nausea or vomiting. 03/22/24   Khan, Fozia M, MD  pantoprazole  (PROTONIX ) 20 MG tablet Take 1 tablet (20 mg total) by mouth daily. 03/22/24   Celestia Rima, NP    History reviewed. No pertinent family history.   Social History[1]  Allergies as of  03/23/2024   (No Known Allergies)    Review of Systems:    All systems reviewed and negative except where noted in HPI.   Physical Exam:  Vital signs in last 24 hours: Temp:  [98 F (36.7 C)-98.7 F (37.1 C)] 98.5 F (36.9 C) (01/13 0705) Pulse Rate:  [98-110] 110 (01/13 0705) Resp:  [16-18] 18 (01/13 0705) BP: (110-129)/(60-75) 129/68 (01/13 0705) SpO2:  [98 %-100 %] 100 % (01/13 0705) Weight:  [70 kg-70.5 kg] 70 kg (01/13 0706)   General:   Pleasant, cooperative in NAD Head:  Normocephalic and atraumatic. Eyes:   No icterus.   Conjunctiva pink. PERRLA. Ears:  Normal auditory acuity. Neck:  Supple; no masses or thyroidomegaly Lungs: Respirations even and unlabored. Lungs clear to auscultation bilaterally.   No wheezes, crackles, or rhonchi.  Heart:  Regular rate and rhythm;  Without murmur, clicks, rubs or gallops Abdomen:  Soft, nondistended, nontender. Normal bowel sounds. No appreciable masses or hepatomegaly.  No rebound or guarding.  Rectal:  Not performed. Msk:  Symmetrical without gross deformities.   Extremities:  Without edema, cyanosis or clubbing. Neurologic:  Alert and oriented x3;  grossly normal neurologically. Skin:  Intact without significant lesions or rashes. Cervical Nodes:  No significant cervical adenopathy. Psych:  Alert and cooperative. Normal affect.  LAB RESULTS: Recent Labs    03/21/24 1302 03/23/24 0708  WBC 4.8 6.8  HGB 9.7* 8.4*  HCT 31.5* 26.7*  PLT 317 281   BMET Recent Labs    03/21/24 1302 03/23/24 0708  NA 143 137  K 4.3 3.8  CL 106 103  CO2 26 25  GLUCOSE 109* 109*  BUN 13 17  CREATININE 0.82 0.68  CALCIUM  9.1 9.2   LFT Recent Labs    03/23/24 0708  PROT 6.6  ALBUMIN 4.2  AST 21  ALT 18  ALKPHOS 45  BILITOT 0.4   PT/INR No results for input(s): LABPROT, INR in the last 72 hours.  STUDIES: No results found.    Impression / Plan:   Assessment: Active Problems:   * No active hospital problems.  *   Lupe Handley Cedillo is a 64 y.o. y/o female with maroon stools with clots.  The patient had a drop in her hemoglobin from a few days ago.  She reports that she has some crampy abdominal pain when she has bleeding.   The patient is now being seen for rectal bleeding from what appears to be a lower GI bleed.  The differential diagnosis includes diverticular bleeding versus hemorrhoidal bleeding versus a neoplasm.  It is also possible that the patient may have a small bowel bleed causing her symptoms  Plan:  The patient will be set up for a colonoscopy for tomorrow.  She will be given a  prep today and be kept n.p.o. after midnight for a colonoscopy for tomorrow.  The patient has been explained the plan including risk benefits and alternatives and agrees to proceeding with the colonoscopy.  The patient has been told that she would need to take the entire prep so that we can get a good look around and she has agreed. If there is any further significant bleeding then a CT angiography should be considered with subsequent vascular surgery intervention if positive.  Otherwise we will proceed with a colonoscopy for tomorrow.  Thank you for involving me in the care of this patient.      LOS: 0 days   Rogelia Copping, MD, MD. NOLIA 03/23/2024, 8:04 AM,  Pager [phone] 7am-5pm  Check AMION for 5pm -7am coverage and on weekends   Note: This dictation was prepared with Dragon dictation along with smaller phrase technology. Any transcriptional errors that result from this process are unintentional.       [1]  Social History Tobacco Use   Smoking status: Never   Smokeless tobacco: Never  Vaping Use   Vaping status: Never Used  Substance Use Topics   Alcohol use: No   Drug use: No   "

## 2024-03-23 NOTE — ED Notes (Signed)
"  Hospitalist at bedside assessing patient  "

## 2024-03-23 NOTE — H&P (Signed)
 " History and Physical    Shelley Flowers FMW:[id number] DOB: Nov 17, 1960 DOA: 03/23/2024  PCP: Fernand Sigrid HERO, MD (Confirm with patient/family/NH records and if not entered, this has to be entered at [facility] point of entry) Patient coming from: Home  I have personally briefly reviewed patient's old medical records in [facility] Link  Chief Complaint: Rectal bleeding  HPI: Shelley Flowers is a 64 y.o. female with medical history significant of HTN, IBS, GERD, HLD, hemorrhoids, presented with persistent rectal bleeding.  Symptoms started last Saturday, patient started to pass bright red blood with blood clots, initially with heavy 6 episode on Saturday, denied any abdominal pain.  Sunday she had last episode but continued to have blood clot per rectum.  She came to ED for evaluation but left because of the long wait.  Yesterday the rectal bleeding has become heavier, and for the first time she started develop heaviness sensation of lower abdomen.  Went to see PCP, outpatient labs showed hemoglobin 9.7.  She reported that she has a history of hemorrhoids but has been fairly controlled and the bleeding pattern at this time is very different for past episodes of hemorrhoid bleeding.  She also reported that she has severe constipation, last week she has to use manual disimpaction to pass stool.  Denied any epigastric pain no nauseous vomiting.  This morning she felt lightheadedness and came to ED. ED Course: Borderline tachycardia blood pressure 129/68.  Hemoglobin 8.4 compared to 9.7 after 2 days ago, BUN 17 creatinine 0.6 glucose 108  GI consulted.  Review of Systems: As per HPI otherwise 14 point review of systems negative.   Past Medical History:  Diagnosis Date   GERD (gastroesophageal reflux disease)    Hypertension    IBS (irritable bowel syndrome)     Past Surgical History:  Procedure Laterality Date   CESAREAN SECTION       reports that she has never smoked. She has never used  smokeless tobacco. She reports that she does not drink alcohol and does not use drugs.  Allergies[1]  History reviewed. No pertinent family history.   Prior to Admission medications  Medication Sig Start Date End Date Taking? Authorizing Provider  omeprazole  (PRILOSEC ) 20 MG capsule Take 20 mg by mouth daily. 03/22/24  Yes [provider]  aspirin-acetaminophen -caffeine  (EXCEDRIN MIGRAINE) 250-250-65 MG tablet Take 2 tablets by mouth daily as needed for headache.    [provider]  atorvastatin  (LIPITOR) 20 MG tablet Take 1 tablet (20 mg total) by mouth daily. 03/22/24   Khan, Fozia M, MD  cyclobenzaprine  (FLEXERIL ) 10 MG tablet Take 1 tablet (10 mg total) by mouth 3 (three) times daily as needed for muscle spasms. 06/02/23   Manya Toribio SQUIBB, PA  hydrocortisone  (ANUSOL -HC) 2.5 % rectal cream Place 1 Application rectally 2 (two) times daily. 03/22/24   Celestia Rima, NP  hydrocortisone  (ANUSOL -HC) 25 MG suppository Place 1 suppository (25 mg total) rectally 2 (two) times daily. 03/22/24   Celestia Rima, NP  LINZESS  145 MCG CAPS capsule Take 1 capsule (145 mcg total) by mouth daily. 03/22/24   Khan, Fozia M, MD  olmesartan -hydrochlorothiazide (BENICAR  HCT) 40-25 MG tablet Take 1 tablet by mouth daily. OFFICE VISIT NEEDED FOR ADDITIONAL REFILLS, CALL OFFICE TO SCHEDULE VISIT 11/10/23   Manya Toribio SQUIBB, PA  ondansetron  (ZOFRAN -ODT) 4 MG disintegrating tablet Take 1 tablet (4 mg total) by mouth every 8 (eight) hours as needed for nausea or vomiting. 03/22/24   Khan, Fozia M, MD  pantoprazole  (PROTONIX ) 20 MG tablet Take 1 tablet (20 mg total) by mouth daily. 03/22/24   Celestia Rima, NP    Physical Exam: Vitals:   03/23/24 0705 03/23/24 0706 03/23/24 0830  BP: 129/68  104/60  Pulse: (!) 110  85  Resp: 18    Temp: 98.5 F (36.9 C)    TempSrc: Oral    SpO2: 100%  100%  Weight:  70 kg   Height:  5' (1.524 m)     Constitutional: NAD, calm, comfortable Vitals:   03/23/24  0705 03/23/24 0706 03/23/24 0830  BP: 129/68  104/60  Pulse: (!) 110  85  Resp: 18    Temp: 98.5 F (36.9 C)    TempSrc: Oral    SpO2: 100%  100%  Weight:  70 kg   Height:  5' (1.524 m)    Eyes: PERRL, lids and conjunctivae normal ENMT: Mucous membranes are moist. Posterior pharynx clear of any exudate or lesions.Normal dentition.  Neck: normal, supple, no masses, no thyromegaly Respiratory: clear to auscultation bilaterally, no wheezing, no crackles. Normal respiratory effort. No accessory muscle use.  Cardiovascular: Regular rate and rhythm, no murmurs / rubs / gallops. No extremity edema. 2+ pedal pulses. No carotid bruits.  Abdomen: no tenderness, no masses palpated. No hepatosplenomegaly. Bowel sounds positive.  Musculoskeletal: no clubbing / cyanosis. No joint deformity upper and lower extremities. Good ROM, no contractures. Normal muscle tone.  Skin: no rashes, lesions, ulcers. No induration Neurologic: CN 2-12 grossly intact. Sensation intact, DTR normal. Strength 5/5 in all 4.  Psychiatric: Normal judgment and insight. Alert and oriented x 3. Normal mood.     Labs on Admission: I have personally reviewed following labs and imaging studies  CBC: Recent Labs  Lab 03/21/24 1302 03/23/24 0708  WBC 4.8 6.8  HGB 9.7* 8.4*  HCT 31.5* 26.7*  MCV 93.2 93.7  PLT 317 281   Basic Metabolic Panel: Recent Labs  Lab 03/21/24 1302 03/23/24 0708  NA 143 137  K 4.3 3.8  CL 106 103  CO2 26 25  GLUCOSE 109* 109*  BUN 13 17  CREATININE 0.82 0.68  CALCIUM  9.1 9.2   GFR: Estimated Creatinine Clearance: 62.8 mL/min (by C-G formula based on SCr of 0.68 mg/dL). Liver Function Tests: Recent Labs  Lab 03/21/24 1302 03/23/24 0708  AST 19 21  ALT 17 18  ALKPHOS 52 45  BILITOT 0.4 0.4  PROT 6.9 6.6  ALBUMIN 4.3 4.2   No results for input(s): LIPASE, AMYLASE in the last 168 hours. No results for input(s): AMMONIA in the last 168 hours. Coagulation Profile: No  results for input(s): INR, PROTIME in the last 168 hours. Cardiac Enzymes: No results for input(s): CKTOTAL, CKMB, CKMBINDEX, TROPONINI in the last 168 hours. BNP (last 3 results) No results for input(s): PROBNP in the last 8760 hours. HbA1C: Recent Labs    03/22/24 0905  HGBA1C 6.1*   CBG: No results for input(s): GLUCAP in the last 168 hours. Lipid Profile: No results for input(s): CHOL, HDL, LDLCALC, TRIG, CHOLHDL, LDLDIRECT in the last 72 hours. Thyroid  Function Tests: Recent Labs    03/22/24 1019  TSH 1.230  FREET4 0.89   Anemia Panel: Recent Labs    03/22/24 1019  VITAMINB12 407  FOLATE 16.3  FERRITIN 22  TIBC 346  IRON  42   Urine analysis:    Component Value Date/Time   COLORURINE STRAW (A) 07/01/2023 2117   APPEARANCEUR CLEAR (A) 07/01/2023 2117   APPEARANCEUR Clear 08/27/2012  0547   LABSPEC 1.012 07/01/2023 2117   LABSPEC 1.010 08/27/2012 0547   PHURINE 6.0 07/01/2023 2117   GLUCOSEU NEGATIVE 07/01/2023 2117   GLUCOSEU Negative 08/27/2012 0547   HGBUR NEGATIVE 07/01/2023 2117   BILIRUBINUR NEGATIVE 07/01/2023 2117   BILIRUBINUR Negative 08/27/2012 0547   KETONESUR NEGATIVE 07/01/2023 2117   PROTEINUR NEGATIVE 07/01/2023 2117   NITRITE NEGATIVE 07/01/2023 2117   LEUKOCYTESUR NEGATIVE 07/01/2023 2117   LEUKOCYTESUR Negative 08/27/2012 0547    Radiological Exams on Admission: No results found.  EKG: None  Assessment/Plan Principal Problem:   Rectal bleeding Active Problems:   Lower GI bleed  (please populate well all problems here in Problem List. (For example, if patient is on BP meds at home and you resume or decide to hold them, it is a problem that needs to be her. Same for CAD, COPD, HLD and so on)  Acute blood loss anemia Hematochezia - Given the history of severe constipation and history of diverticulosis, clinically suspect acute diverticulosis bleeding.  GI was consulted in the ED. - Keep patient n.p.o. -  Recheck H&H this afternoon tonight, transfuse for hemodynamic instability or significant drop of H&H.  Will also consider CTA for hemodynamic instability. - Other Dx, no history of peptic ulcer, no epigastric pain, low suspicion of upper GI bleed. - Start iron  supplement  HTN - Hold off home BP meds  HLD - Continue statin  IBS - Continue Linzess  - Educated patient regarding importance of avoiding constipation to prevent future diverticulosis bleeding flareup.   DVT prophylaxis: SCD Code Status: Full code Family Communication: None at bedside Disposition Plan: Expect less than 2 midnight hospital stay Consults called: GI Admission status: Telemetry observation   Cort ONEIDA Mana MD [facility] Pager [phone]  03/23/2024, 9:49 AM       [1] No Known Allergies  "

## 2024-03-24 ENCOUNTER — Observation Stay: Admitting: Anesthesiology

## 2024-03-24 ENCOUNTER — Encounter: Admission: EM | Disposition: A | Payer: Self-pay | Source: Home / Self Care | Attending: Emergency Medicine

## 2024-03-24 ENCOUNTER — Observation Stay

## 2024-03-24 DIAGNOSIS — I10 Essential (primary) hypertension: Secondary | ICD-10-CM | POA: Diagnosis not present

## 2024-03-24 DIAGNOSIS — E669 Obesity, unspecified: Secondary | ICD-10-CM | POA: Diagnosis not present

## 2024-03-24 DIAGNOSIS — G44221 Chronic tension-type headache, intractable: Secondary | ICD-10-CM

## 2024-03-24 DIAGNOSIS — G8929 Other chronic pain: Secondary | ICD-10-CM

## 2024-03-24 DIAGNOSIS — K581 Irritable bowel syndrome with constipation: Secondary | ICD-10-CM | POA: Diagnosis not present

## 2024-03-24 DIAGNOSIS — R519 Headache, unspecified: Secondary | ICD-10-CM | POA: Diagnosis not present

## 2024-03-24 DIAGNOSIS — E785 Hyperlipidemia, unspecified: Secondary | ICD-10-CM

## 2024-03-24 DIAGNOSIS — K922 Gastrointestinal hemorrhage, unspecified: Secondary | ICD-10-CM | POA: Diagnosis not present

## 2024-03-24 DIAGNOSIS — D62 Acute posthemorrhagic anemia: Secondary | ICD-10-CM

## 2024-03-24 DIAGNOSIS — K589 Irritable bowel syndrome without diarrhea: Secondary | ICD-10-CM | POA: Diagnosis not present

## 2024-03-24 HISTORY — PX: COLONOSCOPY: SHX5424

## 2024-03-24 LAB — CBC WITH DIFFERENTIAL/PLATELET
Abs Immature Granulocytes: 0.04 K/uL (ref 0.00–0.07)
Basophils Absolute: 0 K/uL (ref 0.0–0.1)
Basophils Relative: 0 %
Eosinophils Absolute: 0 K/uL (ref 0.0–0.5)
Eosinophils Relative: 0 %
HCT: 24.8 % — ABNORMAL LOW (ref 36.0–46.0)
Hemoglobin: 8.3 g/dL — ABNORMAL LOW (ref 12.0–15.0)
Immature Granulocytes: 1 %
Lymphocytes Relative: 29 %
Lymphs Abs: 1.4 K/uL (ref 0.7–4.0)
MCH: 29.7 pg (ref 26.0–34.0)
MCHC: 33.5 g/dL (ref 30.0–36.0)
MCV: 88.9 fL (ref 80.0–100.0)
Monocytes Absolute: 0.4 K/uL (ref 0.1–1.0)
Monocytes Relative: 8 %
Neutro Abs: 3 K/uL (ref 1.7–7.7)
Neutrophils Relative %: 62 %
Platelets: 213 K/uL (ref 150–400)
RBC: 2.79 MIL/uL — ABNORMAL LOW (ref 3.87–5.11)
RDW: 15.9 % — ABNORMAL HIGH (ref 11.5–15.5)
WBC: 4.9 K/uL (ref 4.0–10.5)
nRBC: 0 % (ref 0.0–0.2)

## 2024-03-24 LAB — CBC
HCT: 21.1 % — ABNORMAL LOW (ref 36.0–46.0)
Hemoglobin: 6.5 g/dL — ABNORMAL LOW (ref 12.0–15.0)
MCH: 29 pg (ref 26.0–34.0)
MCHC: 30.8 g/dL (ref 30.0–36.0)
MCV: 94.2 fL (ref 80.0–100.0)
Platelets: 239 K/uL (ref 150–400)
RBC: 2.24 MIL/uL — ABNORMAL LOW (ref 3.87–5.11)
RDW: 14.3 % (ref 11.5–15.5)
WBC: 3.9 K/uL — ABNORMAL LOW (ref 4.0–10.5)
nRBC: 0 % (ref 0.0–0.2)

## 2024-03-24 LAB — HIV ANTIBODY (ROUTINE TESTING W REFLEX): HIV Screen 4th Generation wRfx: NONREACTIVE

## 2024-03-24 LAB — PREPARE RBC (CROSSMATCH)

## 2024-03-24 MED ORDER — SODIUM CHLORIDE 0.9% IV SOLUTION: Freq: Once | INTRAVENOUS | Status: AC

## 2024-03-24 MED ORDER — DEXMEDETOMIDINE HCL IN NACL 80 MCG/20ML IV SOLN
INTRAVENOUS | Status: DC | PRN
  Administered 2024-03-24: 12 ug via INTRAVENOUS
  Administered 2024-03-24: 8 ug via INTRAVENOUS

## 2024-03-24 MED ORDER — IOHEXOL 350 MG/ML SOLN
100.0000 mL | Freq: Once | INTRAVENOUS | Status: AC | PRN
  Administered 2024-03-24: 100 mL via INTRAVENOUS

## 2024-03-24 MED ORDER — SODIUM CHLORIDE 0.9 % IV SOLN: INTRAVENOUS | Status: DC | PRN

## 2024-03-24 MED ORDER — ACETAMINOPHEN 325 MG PO TABS
650.0000 mg | ORAL_TABLET | Freq: Four times a day (QID) | ORAL | Status: DC | PRN
  Administered 2024-03-24: 650 mg via ORAL
  Filled 2024-03-24: qty 2

## 2024-03-24 MED ORDER — IRON SUCROSE 300 MG IVPB - SIMPLE MED
300.0000 mg | Freq: Once | Status: AC
  Administered 2024-03-24: 300 mg via INTRAVENOUS
  Filled 2024-03-24: qty 300

## 2024-03-24 MED ORDER — PROPOFOL 10 MG/ML IV BOLUS
INTRAVENOUS | Status: DC | PRN
  Administered 2024-03-24 (×2): 50 mg via INTRAVENOUS

## 2024-03-24 MED ORDER — PROPOFOL 500 MG/50ML IV EMUL
INTRAVENOUS | Status: DC | PRN
  Administered 2024-03-24: 75 ug/kg/min via INTRAVENOUS

## 2024-03-24 MED ORDER — LIDOCAINE HCL (CARDIAC) PF 100 MG/5ML IV SOSY
PREFILLED_SYRINGE | INTRAVENOUS | Status: DC | PRN
  Administered 2024-03-24: 60 mg via INTRAVENOUS

## 2024-03-24 MED ORDER — HYDROCODONE-ACETAMINOPHEN 5-325 MG PO TABS: 1.0000 | ORAL_TABLET | ORAL | Status: DC | PRN

## 2024-03-24 MED ORDER — BUTALBITAL-APAP-CAFFEINE 50-325-40 MG PO TABS
1.0000 | ORAL_TABLET | Freq: Four times a day (QID) | ORAL | Status: DC | PRN
  Administered 2024-03-24: 1 via ORAL
  Filled 2024-03-24: qty 1

## 2024-03-24 NOTE — Assessment & Plan Note (Addendum)
 Can restart Linzess  as outpatient.

## 2024-03-24 NOTE — Discharge Instructions (Signed)
 Rent/Utility/Housing  Agency Name: [facility] Agency Address: [address] [place], [place] [address] Phone: [phone] Email: [email or web address] Website: [email or web address] Service(s) Offered: Housing services, self-sufficiency, congregate meal program, weatherization program, field seismologist program, emergency food assistance,  housing counseling, home ownership program, wheels -towork program.  Agency Name: [facility] Address: [address] [place], [place] [address] Phone: [phone] (8a-4p) [phone] (8p- 10p) Email: [email or web address] Website: [email or web address] Service(s) Offered: A program for homeless and/or needy men that includes one-on-one counseling, life skills training and job rehabilitation.  Agency Name: [facility] [place] Address: [address] [place], [place] [address] Phone: [phone] Website: www.[email or web address].org Service(s) Offered: Assistance to needy in emergency with utility bills, heating fuel, and prescriptions. Shelter for homeless 7pm-7am. July 04, 2016 15  Agency Name: [facility] [place] (Developmentally Disabled) Address: [address], [place], [place] [address] Phone: [phone] Contact Person: Lemond Cart Email: [email or web address] Website: [email or web address] Service(s) Offered: Helps individuals with developmental disabilities move from housing that is more restrictive to homes where they  can achieve greater independence and have more  opportunities.  Agency Name: [facility] Address: [address], [place], [place] [address] Phone: [phone] Email: [email or web address] Website: [email or web address].org Service(s) Offered: Provides affordable housing for low-income families, elderly, and disabled individuals. Offer a wide range of  programs and services, from financial planning to  afterschool and summer programs.  Agency Name: [facility] Address: [address] [place], [place] [address] Phone: [phone] Service(s) Offered: Child support services; child welfare services; food stamps; Medicaid; work first family assistance; and aid with fuel,  rent, food and medicine.  Agency Name: [facility] [place] [employer]. Address: [facility] [address], [place], [place]  [address] Phone: [phone] Website: www.familyabuseservices.org Service(s) Offered: 24 hour Crisis Line: [phone]; 24 hour Emergency Shelter; Transitional Housing; Support Groups; Scientist, Physiological; [employer]; Hispanic Outreach: [phone];  Visitation Center: [phone].  Agency Name: [facility] [place]. Address: [address] [place], [place] [address] Phone: [phone] Service(s) Offered: CAP Services; Home and [employer]; Individual or Group Supports; Respite Care Non-Institutional Nursing;  Residential Supports; Respite Care and Personal Care Services; Transportation; Family and Friends Night; Recreational Activities; Three Nutritious Meals/Snacks; Consultation with Registered Dietician; Twenty-four hour Registered Nurse Access; Daily and [employer]; Camp Green Leaves; [place] for the [employer] (During Summer Months) Bingo Night (Every  Wednesday Night); Special Populations Dance Night  (Every Tuesday Night); Professional Hair Care Services.  Agency Name: [facility] Address: [address], [place], [place] [address] Phone: [phone] Contact Person: Meade High Website: [email or web address].[email or web address], Medical) Offered: Residential treatment facility for women; food and  clothing, educational & employment development and  transportation to work; counsellor of financial skills;  parenting and family reunification; emotional and spiritual  support;  transitional housing for program graduates.  Agency Name: [facility] Address: [address] [place], [place] [address] Phone: [phone] Email: [email or web address] Website: [email or web address] Service(s) Offered: Public housing units for elderly, disabled, and low income people; housing choice vouchers for income eligible  applicants; shelter plus care vouchers; and Psychologist, Clinical.  Agency Name: [facility] [employer] Address: [address], [place], [place] [address] Phone: [phone] Email: [email or web address] Website: www.habitatalamance.org Service(s) Offered: Build houses for families in need of decent housing. Each adult in the family must invest 200 hours of labor on  someone elses house, work with volunteers to build their own house, attend classes  on budgeting, home maintenance, yard care, and attend homeowner association meetings.  Agency Name: [facility] Address: [address], [place], [place] [address] Phone: [phone] Website: www.rsli.org Service(s) Offered: Intermediate care facilities for intellectually delayed, Supervised Living in group homes for adults with developmental disabilities, Supervised Living for people who have dual diagnoses (MRMI), Independent Living, Supported Living, respite and a variety of CAP services, pre-vocational services, day supports, and [employer].  Agency Name: [place] [employer] [facility] Phone: [phone] Website: www.NCForeclosurePrevention.gov Service(s) Offered: Zero-interest, deferred loans to homeowners struggling to pay their mortgage. Call for more information.   Agency Name: [facility] Agency Address: [address] [place], [place] [address] Phone: [phone] Website: www.alamanceservices.org Service(s) Offered: Housing services, self-sufficiency, congregate meal program, and individual development account  program.  Agency Name: [facility] [place] Address: [address] [place], [place] [address] Phone: [phone] Email: [email or web address] Website: www.alliedchurches.org Service(s) Offered: Housing the homeless, feeding the hungry, company secretary, job and education related services.  Agency Name: [facility] Address: [address], [place], [place] [address] Phone: [phone] Email: [email or web address] Service(s) Offered: Counseling, problem pregnancy, advocacy for Hispanics, limited emergency financial assistance.  Agency Name: Department [facility] Services Address: [address] [place], [place] [address] Phone: [phone] Website: [email or web address] Service(s) Offered: Child support services; child welfare services; SNAP; Medicaid; work first family assistance; and aid with fuel,  rent, food and medicine.  Agency Name: [facility] Address: [address], [place], [place] [address] Phone: [phone] or [phone] Email: [email or web address] Service(s) Offered: Family services and transient assistance; emergency food, fuel, clothing, limited furniture, utilities; budget counseling, general counseling; give a kid a coat; thrift store; Christmas food and toys. Utility assistance, food pantry, rental  assistance, life sustaining medicine   Do you feel isolated?  The [facility] on Aging offers a [employer] that anyone can call toll free at [phone]. The friendship line is available 24 hours a day  [facility] is a Program of All-inclusive Care for the Elderly (PACE). Their mission is to promote and sustain the independence of seniors wishing to remain in the community. They provide seniors with comprehensive long-term health, social, medical and dietary care. Their program is a safe alternative to nursing home care. [phone]  [facility]  Address [facility] [address] [place], [place] [address] Phone: [phone]. . Online zoom yoga class, connect with others without leaving your home [facility] offers [place] dance cardio sessions for individuals via Zoom. This program provides: - Dance fitness activities Please contact program for more information. Servinganyone in need adults 18+ hiv/aids individuals families Call [phone]  Email [email or web address] [email or web address] to get more info  [employer] offers an online [employer] to individuals where they can receive help to focus on their best health. Whether you're a [employer] member or not, the neighborhood center offers a... Main Serviceshealth education  exercise & fitness  community support services  recreation  virtual support Other Servicessupport groups Servinganyone in need adults young adults teens seniors individuals families [email or web address] to get more info  Schedule on their website  The [facility] offers an array of activities for adults age 34 and over. This program provides:- Fitness and health programs- Tech classes- Activity books Main Serviceshealth education  community support services  exercise & fitness  recreation  more education Servingseniors  Call [phone]    For more resources go online to Rhodeislandbargains.co.uk and type  in you zipcode

## 2024-03-24 NOTE — Op Note (Signed)
 [facility] Gastroenterology Patient Name: Shelley Flowers Procedure Date: 03/24/2024 2:29 PM MRN: [id number] Account #: [id number] Date of Birth: 1960/06/11 Admit Type: Emergency Department Age: 64 Room: [facility] ENDO ROOM 2 Gender: Female Note Status: Finalized Instrument Name: Colon Scope [id number] Procedure:             Colonoscopy Indications:           Hematochezia Providers:             Rogelia Copping MD, MD Referring MD:          Sigrid EMERSON Bathe, MD (Referring MD) Medicines:             Propofol  per Anesthesia Complications:         No immediate complications. Procedure:             Pre-Anesthesia Assessment:                        - Prior to the procedure, a History and Physical was                         performed, and patient medications and allergies were                         reviewed. The patient's tolerance of previous                         anesthesia was also reviewed. The risks and benefits                         of the procedure and the sedation options and risks                         were discussed with the patient. All questions were                         answered, and informed consent was obtained. Prior                         Anticoagulants: The patient has taken no anticoagulant                         or antiplatelet agents. ASA Grade Assessment: II - A                         patient with mild systemic disease. After reviewing                         the risks and benefits, the patient was deemed in                         satisfactory condition to undergo the procedure.                        After obtaining informed consent, the colonoscope was                         passed under direct vision. Throughout the procedure,  the patient's blood pressure, pulse, and oxygen                         saturations were monitored continuously. The                         Colonoscope was introduced through the anus and                          advanced to the the terminal ileum. The colonoscopy                         was performed without difficulty. The patient                         tolerated the procedure well. The quality of the bowel                         preparation was excellent. Findings:      The terminal ileum appeared normal.      Multiple small-mouthed diverticula were found in the sigmoid colon and       descending colon.      Pink fluid was found in the sigmoid colon and in the descending colon.      Non-bleeding internal hemorrhoids were found during retroflexion. The       hemorrhoids were Grade II (internal hemorrhoids that prolapse but reduce       spontaneously). Impression:            - The examined portion of the ileum was normal.                        - Diverticulosis in the sigmoid colon and in the                         descending colon.                        - Blood in the sigmoid colon and in the descending                         colon.                        - Non-bleeding internal hemorrhoids.                        - No specimens collected. Recommendation:        - Return patient to hospital ward for ongoing care.                        - Clear liquid diet.                        - Continue present medications.                        - If any further bleeding then a bleeding scan vs                         repeat CTA with  vascular surgery intervention if                         positive. Procedure Code(s):     --- Professional ---                        [phone], Colonoscopy, flexible; diagnostic, including                         collection of specimen(s) by brushing or washing, when                         performed (separate procedure) Diagnosis Code(s):     --- Professional ---                        K92.1, Melena (includes Hematochezia) CPT copyright 2022 American Medical Association. All rights reserved. The codes documented in this report are preliminary and upon  coder review may  be revised to meet current compliance requirements. Rogelia Copping MD, MD 03/24/2024 2:59:01 PM This report has been signed electronically. Number of Addenda: 0 Note Initiated On: 03/24/2024 2:29 PM Scope Withdrawal Time: 0 hours 7 minutes 39 seconds  Total Procedure Duration: 0 hours 10 minutes 14 seconds  Estimated Blood Loss:  Estimated blood loss: none.      [facility]

## 2024-03-24 NOTE — TOC CM/SW Note (Signed)
 Transition of Care [facility]) - Inpatient Brief Assessment   Patient Details  Name: Shelley Flowers MRN: [id number] Date of Birth: 1960-07-08  Transition of Care [facility]) CM/SW Contact:    Daved JONETTA Hamilton, RN Phone Number: 03/24/2024, 11:43 AM   Clinical Narrative:   Transition of Care (TOC) Screening Note   Patient Details  Name: Shelley Flowers Date of Birth: 10-Dec-1960   Transition of Care [facility]) CM/SW Contact:    Daved JONETTA Hamilton, RN Phone Number: 03/24/2024, 11:43 AM   Resources added to AVS for housing, utilities, social.   Transition of Care Department [facility]) has reviewed patient and no TOC needs have been identified at this time. If new patient transition needs arise, please place a TOC consult.    Transition of Care Asessment: Insurance and Status: Insurance coverage has been reviewed Patient has primary care physician: Yes   Prior level of function:: Independent Prior/Current Home Services: No current home services Social Drivers of Health Review: SDOH reviewed interventions complete Readmission risk has been reviewed: No (patient is observation, no score generated) Transition of care needs: no transition of care needs at this time

## 2024-03-24 NOTE — Anesthesia Preprocedure Evaluation (Signed)
"                                    Anesthesia Evaluation  Patient identified by MRN, date of birth, ID band Patient awake    Reviewed: Allergy & Precautions, NPO status , Patient's Chart, lab work & pertinent test results  Airway Mallampati: II  TM Distance: >3 FB Neck ROM: Full    Dental  (+) Teeth Intact   Pulmonary neg pulmonary ROS   Pulmonary exam normal        Cardiovascular Exercise Tolerance: Good hypertension, Pt. on medications negative cardio ROS Normal cardiovascular exam Rhythm:Regular Rate:Normal     Neuro/Psych  Headaches negative neurological ROS  negative psych ROS   GI/Hepatic negative GI ROS, Neg liver ROS,GERD  ,,  Endo/Other  negative endocrine ROS    Renal/GU negative Renal ROS  negative genitourinary   Musculoskeletal   Abdominal Normal abdominal exam  (+)   Peds negative pediatric ROS (+)  Hematology negative hematology ROS (+) Blood dyscrasia, anemia   Anesthesia Other Findings Past Medical History: No date: GERD (gastroesophageal reflux disease) No date: Hypertension No date: IBS (irritable bowel syndrome)  Past Surgical History: No date: CESAREAN SECTION  BMI    Body Mass Index: 30.14 kg/m      Reproductive/Obstetrics negative OB ROS                              Anesthesia Physical Anesthesia Plan  ASA: 2  Anesthesia Plan: General   Post-op Pain Management:    Induction: Intravenous  PONV Risk Score and Plan: Propofol  infusion and TIVA  Airway Management Planned: Natural Airway and Nasal Cannula  Additional Equipment:   Intra-op Plan:   Post-operative Plan:   Informed Consent: I have reviewed the patients History and Physical, chart, labs and discussed the procedure including the risks, benefits and alternatives for the proposed anesthesia with the patient or authorized representative who has indicated his/her understanding and acceptance.     Dental Advisory  Given  Plan Discussed with: CRNA  Anesthesia Plan Comments:         Anesthesia Quick Evaluation  "

## 2024-03-24 NOTE — Assessment & Plan Note (Addendum)
 Tylenol  as needed.  Fioricet  as needed.  Discontinue Excedrin

## 2024-03-24 NOTE — Plan of Care (Signed)

## 2024-03-24 NOTE — Hospital Course (Addendum)
 64 y.o. female with medical history significant of HTN, IBS, GERD, HLD, hemorrhoids, presented with persistent rectal bleeding.   Symptoms started last Saturday, patient started to pass bright red blood with blood clots, initially with heavy 6 episode on Saturday, denied any abdominal pain.  Sunday she had last episode but continued to have blood clot per rectum.  She came to ED for evaluation but left because of the long wait.  Yesterday the rectal bleeding has become heavier, and for the first time she started develop heaviness sensation of lower abdomen.  Went to see PCP, outpatient labs showed hemoglobin 9.7.  She reported that she has a history of hemorrhoids but has been fairly controlled and the bleeding pattern at this time is very different for past episodes of hemorrhoid bleeding.  She also reported that she has severe constipation, last week she has to use manual disimpaction to pass stool.  Denied any epigastric pain no nauseous vomiting.  This morning she felt lightheadedness and came to ED. ED Course: Borderline tachycardia blood pressure 129/68.  Hemoglobin 8.4 compared to 9.7 after 2 days ago, BUN 17 creatinine 0.6 glucose 108  1/14.  Ordered for 2 units of packed red blood cells with hemoglobin dropping down to 6.5.  CT angio did not show any definite evidence of contrast extravasation to suggest active GI bleeding.  Still having rectal bleeding.  Colonoscopy consistent with diverticular bleeding.  IV Venofer  given. 1/15.  Patient has not seen any further bleeding since colonoscopy.  Had 1 very small bowel movement without blood.  Patient does not want to stay in the hospital any further and wants to go home.  Hemoglobin upon discharge 8.8.

## 2024-03-24 NOTE — Assessment & Plan Note (Signed)
 Class I with a BMI of 30.14

## 2024-03-24 NOTE — Assessment & Plan Note (Addendum)
 CT scan did not show any signs of GI hemorrhage.  Colonoscopy consistent with diverticular bleeding.  Patient did not have any further bleeding since colonoscopy.

## 2024-03-24 NOTE — Progress Notes (Signed)
" ° ° °  PROCEDURAL EXPEDITER PROGRESS NOTE  Patient Name: Shelley Flowers  DOB:28-Sep-1960 Date of Admission: 03/23/2024  Date of Assessment:03/24/2024   -------------------------------------------------------------------------------------------------------------------   Brief clinical summary: pt is a 64 yr old female with Hx of HTN, IBS, GERD, HLD, hemorrhoids and came is with pesistent rectal bleeding.   Pt having a colonoscopy today   Orders in place:  Yes   Communication with surgical team if no orders: n/a  Labs, test, and orders reviewed: yes  Requires surgical clearance:  No  What type of clearance: n/a  Clearance received: n/a  Barriers noted:n/a   Intervention provided by [facility] team: n/a  Barrier resolved:  not applicable   -------------------------------------------------------------------------------------------------------------------  [employer], Ronal DELENA Bald Please contact us  directly via secure chat (search for [facility]) or by calling us  at [phone] [facility]).  "

## 2024-03-24 NOTE — Transfer of Care (Signed)
 Immediate Anesthesia Transfer of Care Note  Patient: Shelley Flowers  Procedure(s) Performed: COLONOSCOPY  Patient Location: PACU  Anesthesia Type:General  Level of Consciousness: sedated  Airway & Oxygen Therapy: Patient Spontanous Breathing  Post-op Assessment: Report given to RN and Post -op Vital signs reviewed and stable  Post vital signs: Reviewed and stable  Last Vitals:  Vitals Value Taken Time  BP 91/53 03/24/24 14:55  Temp    Pulse 83 03/24/24 14:55  Resp 14 03/24/24 14:55  SpO2 99 % 03/24/24 14:55  Vitals shown include unfiled device data.  Last Pain:  Vitals:   03/24/24 1217  TempSrc: Oral  PainSc:          Complications: No notable events documented.

## 2024-03-24 NOTE — Assessment & Plan Note (Addendum)
 From lower GI bleed, likely diverticular in nature.  Patient received 2 units of packed red blood cells and IV iron .  Hemoglobin upon discharge 8.8.  Patient stated she had no further bleeding since colonoscopy.

## 2024-03-24 NOTE — Progress Notes (Addendum)
 Dr. Lawence updated of hemoglobin of 6.5 and that patient wants to speak to Doctor regarding options and plan for the result. Patient doesn't want to sign consent until questions are answered.  At 0400 patient spoke to Dr. Lawence and all her concerns were answered. Patient agreed to have transfusion. Consent signed.

## 2024-03-24 NOTE — Progress Notes (Signed)
 " Progress Note   Patient: Shelley Flowers FMW:[id number] DOB: 10-08-1960 DOA: 03/23/2024     0 DOS: the patient was seen and examined on 03/24/2024   Brief hospital course: 64 y.o. female with medical history significant of HTN, IBS, GERD, HLD, hemorrhoids, presented with persistent rectal bleeding.   Symptoms started last Saturday, patient started to pass bright red blood with blood clots, initially with heavy 6 episode on Saturday, denied any abdominal pain.  Sunday she had last episode but continued to have blood clot per rectum.  She came to ED for evaluation but left because of the long wait.  Yesterday the rectal bleeding has become heavier, and for the first time she started develop heaviness sensation of lower abdomen.  Went to see PCP, outpatient labs showed hemoglobin 9.7.  She reported that she has a history of hemorrhoids but has been fairly controlled and the bleeding pattern at this time is very different for past episodes of hemorrhoid bleeding.  She also reported that she has severe constipation, last week she has to use manual disimpaction to pass stool.  Denied any epigastric pain no nauseous vomiting.  This morning she felt lightheadedness and came to ED. ED Course: Borderline tachycardia blood pressure 129/68.  Hemoglobin 8.4 compared to 9.7 after 2 days ago, BUN 17 creatinine 0.6 glucose 108  1/14.  Ordered for 2 units of packed red blood cells with hemoglobin dropping down to 6.5.  CT angio did not show any definite evidence of contrast extravasation to suggest active GI bleeding.  Colonoscopy for today.  Still having rectal bleeding.  Assessment and Plan: * Acute blood loss anemia From lower GI bleed.  Hemoglobin down to 6.5.  2 units of packed red blood cells ordered.  Likely will need IV iron  at some point.  Lower GI bleeding CT scan did not show any signs of GI hemorrhage.  Colonoscopy today.  Likely diverticular bleed.  Essential hypertension Hold Benicar  HCT while  having GI bleed.  Chronic headache Tylenol  as needed.  Fioricet  as needed.  Try to wean off Excedrin.  Hyperlipidemia Can continue statin  Obesity (BMI 30-39.9) Class I with a BMI of 30.14  IBS (irritable bowel syndrome) Hold Linzess  today since patient had colonoscopy prep        Subjective: Patient has chronic headache and still having headache.  Had quite a bit of bleeding with colonoscopy prep last night.  Hemoglobin dropped down to 6.5.  Ordered for 2 units of blood.  Physical Exam: Vitals:   03/24/24 1008 03/24/24 1038 03/24/24 1039 03/24/24 1217  BP: 114/66 (!) 117/59 (!) 117/59 110/60  Pulse: 85 84 84 82  Resp: 12 14 14 17   Temp: 98.5 F (36.9 C) 98.2 F (36.8 C) 98.2 F (36.8 C) 98.4 F (36.9 C)  TempSrc: Oral Oral Oral Oral  SpO2: 100%  100% 100%  Weight:      Height:       Physical Exam HENT:     Head: Normocephalic.  Eyes:     General: Lids are normal.     Comments: Pale conjunctiva  Cardiovascular:     Rate and Rhythm: Normal rate and regular rhythm.     Heart sounds: Normal heart sounds, S1 normal and S2 normal.  Pulmonary:     Breath sounds: No decreased breath sounds, wheezing, rhonchi or rales.  Abdominal:     Palpations: Abdomen is soft.     Tenderness: There is no abdominal tenderness.  Musculoskeletal:  Right lower leg: No swelling.     Left lower leg: No swelling.  Skin:    General: Skin is warm.     Findings: No rash.  Neurological:     Mental Status: She is alert and oriented to person, place, and time.     Data Reviewed: Platelets of count 3.9, hemoglobin 6.5, platelet count 239  Family Communication: Daughter at bedside  Disposition: Status is: Observation With hemoglobin dropping down to 6.5, 2 units of packed red blood cells ordered.  Colonoscopy today.  Patient still bleeding.  Planned Discharge Destination: Home    Time spent: 28 minutes  Author: Charlie Patterson, MD 03/24/2024 12:29 PM  For on call review  [email or web address].  "

## 2024-03-24 NOTE — Assessment & Plan Note (Addendum)
 Hold Benicar  HCT while having GI bleed.  Can recheck blood pressure as outpatient if blood pressure starts trending above 140/90 can restart Benicar  HCT.

## 2024-03-24 NOTE — Assessment & Plan Note (Signed)
Can continue statin ?

## 2024-03-24 NOTE — Plan of Care (Signed)
  Problem: Clinical Measurements: Goal: Diagnostic test results will improve Outcome: Not Progressing   Problem: Elimination: Goal: Will not experience complications related to bowel motility Outcome: Not Progressing   

## 2024-03-25 ENCOUNTER — Encounter: Payer: Self-pay | Admitting: Gastroenterology

## 2024-03-25 DIAGNOSIS — R519 Headache, unspecified: Secondary | ICD-10-CM | POA: Diagnosis not present

## 2024-03-25 DIAGNOSIS — K589 Irritable bowel syndrome without diarrhea: Secondary | ICD-10-CM

## 2024-03-25 DIAGNOSIS — I10 Essential (primary) hypertension: Secondary | ICD-10-CM | POA: Diagnosis not present

## 2024-03-25 DIAGNOSIS — G8929 Other chronic pain: Secondary | ICD-10-CM

## 2024-03-25 DIAGNOSIS — K922 Gastrointestinal hemorrhage, unspecified: Secondary | ICD-10-CM | POA: Diagnosis not present

## 2024-03-25 DIAGNOSIS — D62 Acute posthemorrhagic anemia: Secondary | ICD-10-CM | POA: Diagnosis not present

## 2024-03-25 LAB — TYPE AND SCREEN
ABO/RH(D): A POS
Antibody Screen: NEGATIVE
Unit division: 0
Unit division: 0

## 2024-03-25 LAB — BPAM RBC
Blood Product Expiration Date: 202601202359
Blood Product Expiration Date: 202601202359
ISSUE DATE / TIME: 202601140441
ISSUE DATE / TIME: 202601141017
Unit Type and Rh: 6200
Unit Type and Rh: 6200

## 2024-03-25 LAB — CBC
HCT: 26.9 % — ABNORMAL LOW (ref 36.0–46.0)
Hemoglobin: 8.8 g/dL — ABNORMAL LOW (ref 12.0–15.0)
MCH: 29.8 pg (ref 26.0–34.0)
MCHC: 32.7 g/dL (ref 30.0–36.0)
MCV: 91.2 fL (ref 80.0–100.0)
Platelets: 223 K/uL (ref 150–400)
RBC: 2.95 MIL/uL — ABNORMAL LOW (ref 3.87–5.11)
RDW: 16.3 % — ABNORMAL HIGH (ref 11.5–15.5)
WBC: 4 K/uL (ref 4.0–10.5)
nRBC: 0.5 % — ABNORMAL HIGH (ref 0.0–0.2)

## 2024-03-25 LAB — BASIC METABOLIC PANEL WITH GFR
Anion gap: 9 (ref 5–15)
BUN: 9 mg/dL (ref 8–23)
CO2: 24 mmol/L (ref 22–32)
Calcium: 8.4 mg/dL — ABNORMAL LOW (ref 8.9–10.3)
Chloride: 108 mmol/L (ref 98–111)
Creatinine, Ser: 0.57 mg/dL (ref 0.44–1.00)
GFR, Estimated: >60 mL/min
Glucose, Bld: 80 mg/dL (ref 70–99)
Potassium: 3.3 mmol/L — ABNORMAL LOW (ref 3.5–5.1)
Sodium: 141 mmol/L (ref 135–145)

## 2024-03-25 MED ORDER — POTASSIUM CHLORIDE CRYS ER 20 MEQ PO TBCR
20.0000 meq | EXTENDED_RELEASE_TABLET | Freq: Once | ORAL | Status: AC
  Administered 2024-03-25: 20 meq via ORAL
  Filled 2024-03-25: qty 1

## 2024-03-25 MED ORDER — ACETAMINOPHEN 325 MG PO TABS: 650.0000 mg | ORAL_TABLET | Freq: Four times a day (QID) | ORAL | Status: AC | PRN

## 2024-03-25 MED ORDER — BUTALBITAL-APAP-CAFFEINE 50-325-40 MG PO TABS: 1.0000 | ORAL_TABLET | Freq: Four times a day (QID) | ORAL | 0 refills | Status: AC | PRN

## 2024-03-25 NOTE — Anesthesia Postprocedure Evaluation (Signed)
"   Anesthesia Post Note  Patient: Shelley Flowers  Procedure(s) Performed: COLONOSCOPY  Patient location during evaluation: PACU Anesthesia Type: General Level of consciousness: awake Pain management: satisfactory to patient Vital Signs Assessment: post-procedure vital signs reviewed and stable Respiratory status: spontaneous breathing Cardiovascular status: stable Anesthetic complications: no   No notable events documented.   Last Vitals:  Vitals:   03/24/24 2000 03/25/24 0514  BP: (!) 114/56 120/66  Pulse: 78 83  Resp: 18 20  Temp: 36.4 C 36.7 C  SpO2: 99% 100%    Last Pain:  Vitals:   03/25/24 0514  TempSrc: Oral  PainSc:                  VAN STAVEREN,Gable Odonohue      "

## 2024-03-25 NOTE — Plan of Care (Signed)
  Problem: Education: Goal: Knowledge of General Education information will improve Description: Including pain rating scale, medication(s)/side effects and non-pharmacologic comfort measures Outcome: Progressing   Problem: Pain Managment: Goal: General experience of comfort will improve and/or be controlled Outcome: Progressing

## 2024-03-25 NOTE — Progress Notes (Signed)
 Patient has been discharged to home with family. Alert and oriented X4. Ambulates independently. IV removed. Catheter and tip intact Site covered with gauze and tape. Staff to transport patient to lobby for exit.Shelley Flowers

## 2024-03-25 NOTE — Discharge Summary (Signed)
 " Physician Discharge Summary   Patient: Shelley Flowers MRN: [id number] DOB: 10-15-1960  Admit date:     03/23/2024  Discharge date: 03/25/24  Discharge Physician: Charlie Patterson   PCP: Fernand Sigrid HERO, MD   Recommendations at discharge:   Keep appointment with PCP next week  Discharge Diagnoses: Principal Problem:   Acute blood loss anemia Active Problems:   Lower GI bleeding   Essential hypertension   Hyperlipidemia   Chronic headache   Obesity (BMI 30-39.9)   IBS (irritable bowel syndrome)    Hospital Course: 64 y.o. female with medical history significant of HTN, IBS, GERD, HLD, hemorrhoids, presented with persistent rectal bleeding.   Symptoms started last Saturday, patient started to pass bright red blood with blood clots, initially with heavy 6 episode on Saturday, denied any abdominal pain.  Sunday she had last episode but continued to have blood clot per rectum.  She came to ED for evaluation but left because of the long wait.  Yesterday the rectal bleeding has become heavier, and for the first time she started develop heaviness sensation of lower abdomen.  Went to see PCP, outpatient labs showed hemoglobin 9.7.  She reported that she has a history of hemorrhoids but has been fairly controlled and the bleeding pattern at this time is very different for past episodes of hemorrhoid bleeding.  She also reported that she has severe constipation, last week she has to use manual disimpaction to pass stool.  Denied any epigastric pain no nauseous vomiting.  This morning she felt lightheadedness and came to ED. ED Course: Borderline tachycardia blood pressure 129/68.  Hemoglobin 8.4 compared to 9.7 after 2 days ago, BUN 17 creatinine 0.6 glucose 108  1/14.  Ordered for 2 units of packed red blood cells with hemoglobin dropping down to 6.5.  CT angio did not show any definite evidence of contrast extravasation to suggest active GI bleeding.  Still having rectal bleeding.  Colonoscopy  consistent with diverticular bleeding.  IV Venofer  given. 1/15.  Patient has not seen any further bleeding since colonoscopy.  Had 1 very small bowel movement without blood.  Patient does not want to stay in the hospital any further and wants to go home.  Hemoglobin upon discharge 8.8.  Assessment and Plan: * Acute blood loss anemia From lower GI bleed, likely diverticular in nature.  Patient received 2 units of packed red blood cells and IV iron .  Hemoglobin upon discharge 8.8.  Patient stated she had no further bleeding since colonoscopy.  Lower GI bleeding CT scan did not show any signs of GI hemorrhage.  Colonoscopy consistent with diverticular bleeding.  Patient did not have any further bleeding since colonoscopy.  Essential hypertension Hold Benicar  HCT while having GI bleed.  Can recheck blood pressure as outpatient if blood pressure starts trending above 140/90 can restart Benicar  HCT.  Chronic headache Tylenol  as needed.  Fioricet  as needed.  Discontinue Excedrin  Hyperlipidemia Can continue statin  Obesity (BMI 30-39.9) Class I with a BMI of 30.14  IBS (irritable bowel syndrome) Can restart Linzess  as outpatient.         Consultants: Gastroenterology Procedures performed: Colonoscopy Disposition: Home Diet recommendation:  Regular diet DISCHARGE MEDICATION: Allergies as of 03/25/2024   No Known Allergies      Medication List     STOP taking these medications    aspirin-acetaminophen -caffeine  250-250-65 MG tablet Commonly known as: EXCEDRIN MIGRAINE   hydrocortisone  2.5 % rectal cream Commonly known as: ANUSOL -HC   hydrocortisone  25  MG suppository Commonly known as: ANUSOL -HC   olmesartan -hydrochlorothiazide 40-25 MG tablet Commonly known as: BENICAR  HCT   omeprazole  20 MG capsule Commonly known as: PRILOSEC        TAKE these medications    acetaminophen  325 MG tablet Commonly known as: TYLENOL  Take 2 tablets (650 mg total) by mouth every 6  (six) hours as needed for mild pain (pain score 1-3), moderate pain (pain score 4-6) or headache.   atorvastatin  20 MG tablet Commonly known as: LIPITOR Take 1 tablet (20 mg total) by mouth daily.   butalbital -acetaminophen -caffeine  50-325-40 MG tablet Commonly known as: FIORICET  Take 1 tablet by mouth every 6 (six) hours as needed for migraine (intractable headache).   cyclobenzaprine  10 MG tablet Commonly known as: FLEXERIL  Take 1 tablet (10 mg total) by mouth 3 (three) times daily as needed for muscle spasms.   Linzess  145 MCG Caps capsule Generic drug: linaclotide  Take 1 capsule (145 mcg total) by mouth daily.   ondansetron  4 MG disintegrating tablet Commonly known as: ZOFRAN -ODT Take 1 tablet (4 mg total) by mouth every 8 (eight) hours as needed for nausea or vomiting.   pantoprazole  20 MG tablet Commonly known as: Protonix  Take 1 tablet (20 mg total) by mouth daily.        Follow-up Information     Khan, Fozia M, MD Follow up in 5 day(s).   Specialties: Internal Medicine, Cardiology Contact information: [address] [place] [address] [phone]                Discharge Exam: Fredricka Weights   03/23/24 0706  Weight: 70 kg   Physical Exam HENT:     Head: Normocephalic.  Eyes:     General: Lids are normal.  Cardiovascular:     Rate and Rhythm: Normal rate and regular rhythm.     Heart sounds: Normal heart sounds, S1 normal and S2 normal.  Pulmonary:     Breath sounds: No decreased breath sounds, wheezing, rhonchi or rales.  Abdominal:     Palpations: Abdomen is soft.     Tenderness: There is no abdominal tenderness.  Musculoskeletal:     Right lower leg: No swelling.     Left lower leg: No swelling.  Skin:    General: Skin is warm.     Findings: No rash.  Neurological:     Mental Status: She is alert and oriented to person, place, and time.      Condition at discharge: stable  The results of significant diagnostics from this  hospitalization (including imaging, microbiology, ancillary and laboratory) are listed below for reference.   Imaging Studies: CT Angio Abd/Pel w/ and/or w/o Result Date: 03/24/2024 CLINICAL DATA:  Lower gastrointestinal bleeding EXAM: CTA ABDOMEN AND PELVIS WITHOUT AND WITH CONTRAST TECHNIQUE: Multidetector CT imaging of the abdomen and pelvis was performed using the standard protocol during bolus administration of intravenous contrast. Multiplanar reconstructed images and MIPs were obtained and reviewed to evaluate the vascular anatomy. RADIATION DOSE REDUCTION: This exam was performed according to the departmental dose-optimization program which includes automated exposure control, adjustment of the mA and/or kV according to patient size and/or use of iterative reconstruction technique. CONTRAST:  OMNIPAQUE  IOHEXOL  350 MG/ML SOLN COMPARISON:  July 01, 2023 FINDINGS: VASCULAR Aorta: Normal caliber aorta without aneurysm, dissection, vasculitis or significant stenosis. Celiac: Patent without evidence of aneurysm, dissection, vasculitis or significant stenosis. SMA: Patent without evidence of aneurysm, dissection, vasculitis or significant stenosis. Renals: Both renal arteries are patent without evidence of  aneurysm, dissection, vasculitis, fibromuscular dysplasia or significant stenosis. IMA: Patent without evidence of aneurysm, dissection, vasculitis or significant stenosis. Inflow: Patent without evidence of aneurysm, dissection, vasculitis or significant stenosis. Proximal Outflow: Bilateral common femoral and visualized portions of the superficial and profunda femoral arteries are patent without evidence of aneurysm, dissection, vasculitis or significant stenosis. Veins: No obvious venous abnormality within the limitations of this arterial phase study. Review of the MIP images confirms the above findings. NON-VASCULAR Lower chest: No acute abnormality. Hepatobiliary: No focal liver abnormality is  seen. No gallstones, gallbladder wall thickening, or biliary dilatation. Pancreas: Unremarkable. No pancreatic ductal dilatation or surrounding inflammatory changes. Spleen: Normal in size without focal abnormality. Adrenals/Urinary Tract: Adrenal glands are unremarkable. Kidneys are normal, without renal calculi, focal lesion, or hydronephrosis. Bladder is unremarkable. Stomach/Bowel: Stomach is within normal limits. Appendix appears normal. No evidence of bowel wall thickening, distention, or inflammatory changes. No definite evidence of contrast extravasation to suggest active gastrointestinal bleeding. Lymphatic: No adenopathy. Reproductive: Uterus and bilateral adnexa are unremarkable. Other: No abdominal wall hernia or abnormality. No abdominopelvic ascites. Musculoskeletal: No acute or significant osseous findings. IMPRESSION: 1. VASCULAR No definite evidence of contrast extravasation to suggest active gastrointestinal bleeding. 2. No significant vascular abnormality seen in the abdomen or pelvis. 3. NON-VASCULAR No definite abnormality seen in the abdomen or pelvis. Electronically Signed   By: Lynwood Landy Raddle M.D.   On: 03/24/2024 09:28    Microbiology: Results for orders placed or performed during the hospital encounter of 11/15/23  Resp panel by RT-PCR (RSV, Flu A&B, Covid) Anterior Nasal Swab     Status: Abnormal   Collection Time: 11/15/23 10:01 AM   Specimen: Anterior Nasal Swab  Result Value Ref Range Status   SARS Coronavirus 2 by RT PCR POSITIVE (A) NEGATIVE Final    Comment: (NOTE) SARS-CoV-2 target nucleic acids are DETECTED.  The SARS-CoV-2 RNA is generally detectable in upper respiratory specimens during the acute phase of infection. Positive results are indicative of the presence of the identified virus, but do not rule out bacterial infection or co-infection with other pathogens not detected by the test. Clinical correlation with patient history and other diagnostic  information is necessary to determine patient infection status. The expected result is Negative.  Fact Sheet for Patients: bloggercourse.com  Fact Sheet for Healthcare Providers: seriousbroker.it  This test is not yet approved or cleared by the [place]  FDA and  has been authorized for detection and/or diagnosis of SARS-CoV-2 by FDA under an Emergency Use Authorization (EUA).  This EUA will remain in effect (meaning this test can be used) for the duration of  the COVID-19 declaration under Section 564(b)(1) of the A ct, 21 U.S.C. section 360bbb-3(b)(1), unless the authorization is terminated or revoked sooner.     Influenza A by PCR NEGATIVE NEGATIVE Final   Influenza B by PCR NEGATIVE NEGATIVE Final    Comment: (NOTE) The Xpert Xpress SARS-CoV-2/FLU/RSV plus assay is intended as an aid in the diagnosis of influenza from Nasopharyngeal swab specimens and should not be used as a sole basis for treatment. Nasal washings and aspirates are unacceptable for Xpert Xpress SARS-CoV-2/FLU/RSV testing.  Fact Sheet for Patients: bloggercourse.com  Fact Sheet for Healthcare Providers: seriousbroker.it  This test is not yet approved or cleared by the [place]  FDA and has been authorized for detection and/or diagnosis of SARS-CoV-2 by FDA under an Emergency Use Authorization (EUA). This EUA will remain in effect (meaning this test can be used) for the duration  of the COVID-19 declaration under Section 564(b)(1) of the Act, 21 U.S.C. section 360bbb-3(b)(1), unless the authorization is terminated or revoked.     Resp Syncytial Virus by PCR NEGATIVE NEGATIVE Final    Comment: (NOTE) Fact Sheet for Patients: bloggercourse.com  Fact Sheet for Healthcare Providers: seriousbroker.it  This test is not yet approved or cleared by the  [place]  FDA and has been authorized for detection and/or diagnosis of SARS-CoV-2 by FDA under an Emergency Use Authorization (EUA). This EUA will remain in effect (meaning this test can be used) for the duration of the COVID-19 declaration under Section 564(b)(1) of the Act, 21 U.S.C. section 360bbb-3(b)(1), unless the authorization is terminated or revoked.  Performed at [facility], [address] [place], [place] [address]     Labs: CBC: Recent Labs  Lab 03/21/24 1302 03/23/24 0708 03/23/24 1539 03/23/24 1959 03/24/24 0255 03/24/24 1558 03/25/24 0518  WBC 4.8 6.8  --   --  3.9* 4.9 4.0  NEUTROABS  --   --   --   --   --  3.0  --   HGB 9.7* 8.4* 7.1* 7.5* 6.5* 8.3* 8.8*  HCT 31.5* 26.7* 22.6* 23.8* 21.1* 24.8* 26.9*  MCV 93.2 93.7  --   --  94.2 88.9 91.2  PLT 317 281  --   --  239 213 223   Basic Metabolic Panel: Recent Labs  Lab 03/21/24 1302 03/23/24 0708 03/25/24 0518  NA 143 137 141  K 4.3 3.8 3.3*  CL 106 103 108  CO2 26 25 24   GLUCOSE 109* 109* 80  BUN 13 17 9   CREATININE 0.82 0.68 0.57  CALCIUM  9.1 9.2 8.4*   Liver Function Tests: Recent Labs  Lab 03/21/24 1302 03/23/24 0708  AST 19 21  ALT 17 18  ALKPHOS 52 45  BILITOT 0.4 0.4  PROT 6.9 6.6  ALBUMIN 4.3 4.2   CBG: No results for input(s): GLUCAP in the last 168 hours.  Discharge time spent: greater than 30 minutes.  Signed: Charlie Patterson, MD [facility] 03/25/2024 "

## 2024-03-25 NOTE — Plan of Care (Signed)
  Problem: Nutrition: Goal: Adequate nutrition will be maintained Outcome: Progressing   Problem: Elimination: Goal: Will not experience complications related to bowel motility Outcome: Progressing   Problem: Pain Managment: Goal: General experience of comfort will improve and/or be controlled Outcome: Progressing   Problem: Safety: Goal: Ability to remain free from injury will improve Outcome: Progressing   Problem: Skin Integrity: Goal: Risk for impaired skin integrity will decrease Outcome: Progressing

## 2024-03-26 NOTE — ED Notes (Addendum)
 Attemped to call patient regarding her care concerns while in the ED. No response, left a HIPAA compliant message. [id number]- Pt did call back and gave detailed description of concerns. Will follow up with staff involved to get feedback and provide education.

## 2024-03-30 ENCOUNTER — Inpatient Hospital Stay: Admitting: Internal Medicine

## 2024-03-31 ENCOUNTER — Telehealth: Payer: Self-pay | Admitting: Internal Medicine

## 2024-03-31 NOTE — Telephone Encounter (Signed)
 Received medical clearance from [facility]. Gave to [facility] for signature-Toni

## 2024-04-01 ENCOUNTER — Telehealth: Payer: Self-pay | Admitting: Internal Medicine

## 2024-04-01 NOTE — Telephone Encounter (Signed)
 Medical clearance signed & faxed back to [facility]; [phone]. Scanned-Toni

## 2024-04-06 ENCOUNTER — Ambulatory Visit (INDEPENDENT_AMBULATORY_CARE_PROVIDER_SITE_OTHER): Admitting: Internal Medicine

## 2024-04-06 VITALS — BP 135/75 | HR 70 | Temp 97.8°F | Resp 16 | Ht 60.0 in | Wt 156.0 lb

## 2024-04-06 DIAGNOSIS — I10 Essential (primary) hypertension: Secondary | ICD-10-CM | POA: Diagnosis not present

## 2024-04-06 DIAGNOSIS — K922 Gastrointestinal hemorrhage, unspecified: Secondary | ICD-10-CM

## 2024-04-06 DIAGNOSIS — D62 Acute posthemorrhagic anemia: Secondary | ICD-10-CM

## 2024-04-06 DIAGNOSIS — Z1231 Encounter for screening mammogram for malignant neoplasm of breast: Secondary | ICD-10-CM

## 2024-04-06 DIAGNOSIS — Z9289 Personal history of other medical treatment: Secondary | ICD-10-CM | POA: Diagnosis not present

## 2024-04-06 MED ORDER — OLMESARTAN MEDOXOMIL 20 MG PO TABS: 20.0000 mg | ORAL_TABLET | Freq: Every day | ORAL | 3 refills | Status: AC

## 2024-04-08 ENCOUNTER — Telehealth: Payer: Self-pay | Admitting: Internal Medicine

## 2024-04-08 NOTE — Telephone Encounter (Signed)
 Accidental claim completed & faxed to [facility]; [phone], Scanned. Lvm for patient to see if she wants p/u-Shelley Flowers

## 2024-06-08 ENCOUNTER — Other Ambulatory Visit: Admitting: Internal Medicine

## 2024-06-22 ENCOUNTER — Ambulatory Visit: Admit: 2024-06-22 | Admitting: Gastroenterology

## 2024-06-22 SURGERY — EGD (ESOPHAGOGASTRODUODENOSCOPY)
Anesthesia: General

## 2024-06-28 ENCOUNTER — Ambulatory Visit: Admitting: Family Medicine
